# Patient Record
Sex: Male | Born: 1990
Health system: Southern US, Community
[De-identification: ages and names within clinical notes are randomized; demographics above are authoritative.]

## PROBLEM LIST (undated history)

## (undated) DIAGNOSIS — F988 Other specified behavioral and emotional disorders with onset usually occurring in childhood and adolescence: Secondary | ICD-10-CM

## (undated) DIAGNOSIS — J45990 Exercise induced bronchospasm: Secondary | ICD-10-CM

## (undated) DIAGNOSIS — G4730 Sleep apnea, unspecified: Secondary | ICD-10-CM

## (undated) DIAGNOSIS — H5000 Unspecified esotropia: Secondary | ICD-10-CM

## (undated) DIAGNOSIS — F329 Major depressive disorder, single episode, unspecified: Secondary | ICD-10-CM

## (undated) HISTORY — PX: ELECTROCONVULSIVE THERAPY: SHX1495

---
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
## 2014-12-08 ENCOUNTER — Other Ambulatory Visit: Payer: Self-pay

## 2014-12-08 DIAGNOSIS — F988 Other specified behavioral and emotional disorders with onset usually occurring in childhood and adolescence: Secondary | ICD-10-CM

## 2014-12-08 MED ORDER — AMPHETAMINE-DEXTROAMPHETAMINE 20 MG PO TABS: 20.0000 mg | ORAL_TABLET | Freq: Every day | ORAL | Status: DC

## 2014-12-08 MED ORDER — AMPHETAMINE-DEXTROAMPHET ER 30 MG PO CP24: 30.0000 mg | ORAL_CAPSULE | Freq: Every day | ORAL | Status: DC

## 2015-03-21 ENCOUNTER — Encounter: Payer: Self-pay | Admitting: Family Medicine

## 2015-03-21 ENCOUNTER — Ambulatory Visit (INDEPENDENT_AMBULATORY_CARE_PROVIDER_SITE_OTHER): Payer: 59 | Admitting: Family Medicine

## 2015-03-21 VITALS — BP 122/80 | HR 64 | Temp 98.6°F | Resp 16 | Ht 74.5 in | Wt 192.0 lb

## 2015-03-21 DIAGNOSIS — Z8709 Personal history of other diseases of the respiratory system: Secondary | ICD-10-CM | POA: Insufficient documentation

## 2015-03-21 DIAGNOSIS — F988 Other specified behavioral and emotional disorders with onset usually occurring in childhood and adolescence: Secondary | ICD-10-CM | POA: Insufficient documentation

## 2015-03-21 DIAGNOSIS — K529 Noninfective gastroenteritis and colitis, unspecified: Secondary | ICD-10-CM | POA: Insufficient documentation

## 2015-03-21 DIAGNOSIS — F329 Major depressive disorder, single episode, unspecified: Secondary | ICD-10-CM | POA: Diagnosis not present

## 2015-03-21 DIAGNOSIS — F32A Depression, unspecified: Secondary | ICD-10-CM

## 2015-03-21 DIAGNOSIS — S42409A Unspecified fracture of lower end of unspecified humerus, initial encounter for closed fracture: Secondary | ICD-10-CM | POA: Insufficient documentation

## 2015-03-21 MED ORDER — BUPROPION HCL ER (SR) 150 MG PO TB12: 150.0000 mg | ORAL_TABLET | Freq: Two times a day (BID) | ORAL | Status: DC

## 2015-03-21 NOTE — Progress Notes (Signed)
Subjective:    Patient ID: Matthew Romero, male    DOB: 1991/01/07, 24 y.o.   MRN: [id number]  Depression        The onset quality is gradual.   The problem has been gradually worsening since onset.  Associated symptoms include suicidal ideas.  Associated symptoms include no decreased concentration and no headaches.( Some thoughts but no plan.  Would not care if wakes up.  Almost daily. Since got home. Not much as school.)     The symptoms are aggravated by social issues.   Has been depressed as long as he can remember.  Gets better and worse at times. 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 Trying to get a better paying job.  Wants to go back [place], if he can afford it.  A lot of his peers are there. Also, has a girl friend in [place].  Sertraline made him too flat.  Made him feel kind of numb.        Patient Active Problem List   Diagnosis Date Noted  . ADD (attention deficit disorder) 03/21/2015  . Enterogastritis 03/21/2015  . Closed fracture of part of lower end of humerus 03/21/2015  . H/O respiratory system disease 03/21/2015   Family History  Problem Relation Age of Onset  . ADD / ADHD Mother   . Depression Mother   . Depression Father   . ADD / ADHD Brother   . ADD / ADHD Brother    Social History   Social History  . Marital Status: Single    Spouse Name: N/A  . Number of Children: N/A  . Years of Education: College   Occupational History  .      Full-Time   Social History Main Topics  . Smoking status: Never Smoker   . Smokeless tobacco: Never Used  . Alcohol Use: 1.2 oz/week    2 Cans of beer per week  . Drug Use: No  . Sexual Activity: Not on file   Other Topics Concern  . Not on file   Social History Narrative  . No narrative on file   No Known Allergies  Previous Medications   AMPHETAMINE-DEXTROAMPHETAMINE (ADDERALL XR) 30 MG 24 HR CAPSULE    Take 1 capsule (30 mg total) by mouth daily.   AMPHETAMINE-DEXTROAMPHETAMINE (ADDERALL)  20 MG TABLET    Take 1 tablet (20 mg total) by mouth daily.     Review of Systems  Constitutional: Negative.   Respiratory: Positive for apnea (Occasionally). Negative for cough, choking, chest tightness, shortness of breath, wheezing and stridor.   Cardiovascular: Negative for chest pain, palpitations and leg swelling.  Gastrointestinal: Negative.   Neurological: Positive for light-headedness. Negative for dizziness, weakness, numbness and headaches.  Psychiatric/Behavioral: Positive for depression, suicidal ideas, sleep disturbance and dysphoric mood. Negative for hallucinations, behavioral problems, confusion, self-injury, decreased concentration and agitation. The patient is not nervous/anxious and is not hyperactive.        Objective:   Physical Exam  Constitutional: He is oriented to person, place, and time. He appears well-developed and well-nourished.  Neurological: He is alert and oriented to person, place, and time.  Psychiatric: He has a normal mood and affect. His behavior is normal. Judgment and thought content normal.   BP 122/80 mmHg  Pulse 64  Temp(Src) 98.6 F (37 C) (Oral)  Resp 16  Ht 6' 2.5" (1.892 m)  Wt 192 lb (87.091 kg)  BMI 24.33 kg/m2      Assessment &  Plan:  1. Depression  Condition is worsening. Will start medication for better control.  Contracted for safety. Recheck in 2 weeks.  - buPROPion (WELLBUTRIN SR) 150 MG 12 hr tablet; Take 1 tablet (150 mg total) by mouth 2 (two) times daily. 1 a day for 3 days and then one twice a day  Dispense: 180 tablet; Refill: 3  Margarita Rana, MD

## 2015-06-07 ENCOUNTER — Other Ambulatory Visit: Payer: Self-pay

## 2015-06-07 DIAGNOSIS — F329 Major depressive disorder, single episode, unspecified: Secondary | ICD-10-CM

## 2015-06-07 DIAGNOSIS — F32A Depression, unspecified: Secondary | ICD-10-CM

## 2015-06-07 MED ORDER — DULOXETINE HCL 30 MG PO CPEP
30.0000 mg | ORAL_CAPSULE | Freq: Every day | ORAL | Status: DC
Start: 2015-06-07 — End: 2015-06-28

## 2015-06-07 NOTE — Telephone Encounter (Signed)
Pt reports he is currently taking Wellbutrin and has helped some but he is not to goal.  He would like to try Cymbalta 30mg .  He will scheduled an office visit in a few weeks to follow up.   Thanks,   -Mickel Baas

## 2015-06-28 ENCOUNTER — Ambulatory Visit: Payer: Self-pay | Admitting: Family Medicine

## 2015-06-28 ENCOUNTER — Encounter: Payer: Self-pay | Admitting: Family Medicine

## 2015-06-28 ENCOUNTER — Ambulatory Visit (INDEPENDENT_AMBULATORY_CARE_PROVIDER_SITE_OTHER): Payer: 59 | Admitting: Family Medicine

## 2015-06-28 VITALS — BP 104/68 | HR 72 | Temp 98.7°F | Resp 16 | Ht 75.0 in | Wt 184.0 lb

## 2015-06-28 DIAGNOSIS — F988 Other specified behavioral and emotional disorders with onset usually occurring in childhood and adolescence: Secondary | ICD-10-CM

## 2015-06-28 DIAGNOSIS — F909 Attention-deficit hyperactivity disorder, unspecified type: Secondary | ICD-10-CM | POA: Diagnosis not present

## 2015-06-28 DIAGNOSIS — F329 Major depressive disorder, single episode, unspecified: Secondary | ICD-10-CM | POA: Diagnosis not present

## 2015-06-28 DIAGNOSIS — Z23 Encounter for immunization: Secondary | ICD-10-CM

## 2015-06-28 DIAGNOSIS — F32A Depression, unspecified: Secondary | ICD-10-CM

## 2015-06-28 MED ORDER — AMPHETAMINE-DEXTROAMPHETAMINE 20 MG PO TABS: 20.0000 mg | ORAL_TABLET | Freq: Every day | ORAL | Status: DC

## 2015-06-28 MED ORDER — MIRTAZAPINE 15 MG PO TABS: 15.0000 mg | ORAL_TABLET | Freq: Every day | ORAL | Status: DC

## 2015-06-28 NOTE — Progress Notes (Signed)
Patient ID: [name], male   DOB: [birth year], 24 y.o.   MRN: [id number]       Patient: Matthew Romero Male    DOB: [birth year]   24 y.o.   MRN: [id number] Visit Date: 06/28/2015  Today's Provider: Margarita Rana, MD   Chief Complaint  Patient presents with  . Depression    3 month FU.    Subjective:    Depression      The patient presents with depression.  This is a chronic problem.  The current episode started more than 1 year ago.   The problem has been waxing and waning since onset.  Associated symptoms include fatigue and decreased interest.  Associated symptoms include no decreased concentration, no headaches, not sad and no suicidal ideas.( Still depressed.   )  Past treatments include SSRIs - Selective serotonin reuptake inhibitors and SNRIs - Serotonin and norepinephrine reuptake inhibitors.  Compliance with treatment is good.  Previous treatment provided mild relief.  Risk factors include major life event.   Past medical history includes depression.   Patient reports that he has been on Wellbutrin for about 3 months and has noticed a mild improvement in his mood. Patient reports that he has been compliant with taking his medication. Patient reports that he does not take Cymbalta. Took it for about 2 weeks. Did not really help mood.  Did kind of numb his mood, but not as numbing as Zoloft.  Is working 39 hours a week.  Doing some mild exercise, but not a lot of cardio.    Does not take Adderall when he is not in school. Does not think counseling will help.        No Known Allergies Previous Medications   AMPHETAMINE-DEXTROAMPHETAMINE (ADDERALL XR) 30 MG 24 HR CAPSULE    Take 1 capsule (30 mg total) by mouth daily.   AMPHETAMINE-DEXTROAMPHETAMINE (ADDERALL) 20 MG TABLET    Take 1 tablet (20 mg total) by mouth daily.   BUPROPION (WELLBUTRIN SR) 150 MG 12 HR TABLET    Take 1 tablet (150 mg total) by mouth 2 (two) times daily. 1 a day for 3 days and then one twice  a day   DULOXETINE (CYMBALTA) 30 MG CAPSULE    Take 1 capsule (30 mg total) by mouth daily.    Review of Systems  Constitutional: Positive for fatigue.  Respiratory: Negative.   Cardiovascular: Negative.   Neurological: Negative.  Negative for headaches.  Psychiatric/Behavioral: Positive for depression. Negative for suicidal ideas, self-injury and decreased concentration. The patient is not nervous/anxious.     Social History  Substance Use Topics  . Smoking status: Never Smoker   . Smokeless tobacco: Never Used  . Alcohol Use: 1.2 oz/week    2 Cans of beer per week   Objective:   BP 104/68 mmHg  Pulse 72  Temp(Src) 98.7 F (37.1 C)  Resp 16  Ht 6\' 3"  (1.905 m)  Wt 184 lb (83.462 kg)  BMI 23.00 kg/m2  SpO2 98%  Physical Exam  Constitutional: He is oriented to person, place, and time. He appears well-developed and well-nourished.  Neurological: He is alert and oriented to person, place, and time.  Psychiatric: He has a normal mood and affect. His behavior is normal. Judgment and thought content normal.      Assessment & Plan:     1. ADD (attention deficit disorder) Continue current medication.  Not taking currently. Did not fill today.   - amphetamine-dextroamphetamine (ADDERALL)  20 MG tablet; Take 1 tablet (20 mg total) by mouth daily.  Dispense: 1 tablet; Refill: 0  2. Depression Really not improved on previous medication tried.  Too numb on Zoloft and Cymbalta had similar effect. No SI.  Does have sleep issues. Will try Mirtazapine and recheck in 4 weeks. Call in interim if any changes or concerns.  Did encourage him to reconsider counselor.    - mirtazapine (REMERON) 15 MG tablet; Take 1 tablet (15 mg total) by mouth at bedtime.  Dispense: 90 tablet; Refill: 0  3. Need for influenza vaccination Given today.  - Flu Vaccine QUAD 36+ mos PF IM (Fluarix & Fluzone Quad PF)       Margarita Rana, MD  [facility]

## 2015-07-15 ENCOUNTER — Telehealth: Payer: Self-pay | Admitting: Family Medicine

## 2015-07-15 DIAGNOSIS — F329 Major depressive disorder, single episode, unspecified: Secondary | ICD-10-CM

## 2015-07-15 DIAGNOSIS — F32A Depression, unspecified: Secondary | ICD-10-CM

## 2015-07-15 NOTE — Telephone Encounter (Signed)
Talked with  Dad. Patient still depressed. Having at hard time focusing.  Would like to see Dr. Nicolasa Ducking. Will put in order. Can you please check on this Judson Roch. Thanks.

## 2015-07-16 DIAGNOSIS — F900 Attention-deficit hyperactivity disorder, predominantly inattentive type: Secondary | ICD-10-CM | POA: Diagnosis not present

## 2015-07-16 DIAGNOSIS — F331 Major depressive disorder, recurrent, moderate: Secondary | ICD-10-CM | POA: Diagnosis not present

## 2015-07-25 DIAGNOSIS — F331 Major depressive disorder, recurrent, moderate: Secondary | ICD-10-CM | POA: Diagnosis not present

## 2015-07-25 DIAGNOSIS — F900 Attention-deficit hyperactivity disorder, predominantly inattentive type: Secondary | ICD-10-CM | POA: Diagnosis not present

## 2015-07-31 ENCOUNTER — Ambulatory Visit: Payer: Self-pay | Admitting: Family Medicine

## 2015-08-09 DIAGNOSIS — F331 Major depressive disorder, recurrent, moderate: Secondary | ICD-10-CM | POA: Diagnosis not present

## 2015-08-09 DIAGNOSIS — F900 Attention-deficit hyperactivity disorder, predominantly inattentive type: Secondary | ICD-10-CM | POA: Diagnosis not present

## 2015-08-29 DIAGNOSIS — F331 Major depressive disorder, recurrent, moderate: Secondary | ICD-10-CM | POA: Diagnosis not present

## 2015-08-29 DIAGNOSIS — F900 Attention-deficit hyperactivity disorder, predominantly inattentive type: Secondary | ICD-10-CM | POA: Diagnosis not present

## 2015-09-17 DIAGNOSIS — F900 Attention-deficit hyperactivity disorder, predominantly inattentive type: Secondary | ICD-10-CM | POA: Diagnosis not present

## 2015-09-17 DIAGNOSIS — F331 Major depressive disorder, recurrent, moderate: Secondary | ICD-10-CM | POA: Diagnosis not present

## 2015-10-02 DIAGNOSIS — F900 Attention-deficit hyperactivity disorder, predominantly inattentive type: Secondary | ICD-10-CM | POA: Diagnosis not present

## 2015-10-02 DIAGNOSIS — F331 Major depressive disorder, recurrent, moderate: Secondary | ICD-10-CM | POA: Diagnosis not present

## 2015-10-03 DIAGNOSIS — F331 Major depressive disorder, recurrent, moderate: Secondary | ICD-10-CM | POA: Diagnosis not present

## 2015-10-03 DIAGNOSIS — F900 Attention-deficit hyperactivity disorder, predominantly inattentive type: Secondary | ICD-10-CM | POA: Diagnosis not present

## 2015-10-23 DIAGNOSIS — Z79899 Other long term (current) drug therapy: Secondary | ICD-10-CM | POA: Diagnosis not present

## 2015-10-23 DIAGNOSIS — F900 Attention-deficit hyperactivity disorder, predominantly inattentive type: Secondary | ICD-10-CM | POA: Diagnosis not present

## 2015-10-23 DIAGNOSIS — F331 Major depressive disorder, recurrent, moderate: Secondary | ICD-10-CM | POA: Diagnosis not present

## 2015-10-23 LAB — LIPID PANEL
Cholesterol: 173 mg/dL (ref 0–200)
HDL: 63 mg/dL (ref 35–70)
LDL CALC: 87 mg/dL
Triglycerides: 117 mg/dL (ref 40–160)

## 2015-10-23 LAB — HEMOGLOBIN A1C: Hemoglobin A1C: 5.6

## 2016-01-22 DIAGNOSIS — F331 Major depressive disorder, recurrent, moderate: Secondary | ICD-10-CM | POA: Diagnosis not present

## 2016-01-22 DIAGNOSIS — F900 Attention-deficit hyperactivity disorder, predominantly inattentive type: Secondary | ICD-10-CM | POA: Diagnosis not present

## 2016-02-18 DIAGNOSIS — R079 Chest pain, unspecified: Secondary | ICD-10-CM | POA: Diagnosis not present

## 2016-02-18 DIAGNOSIS — R0789 Other chest pain: Secondary | ICD-10-CM | POA: Diagnosis not present

## 2016-04-05 DIAGNOSIS — F331 Major depressive disorder, recurrent, moderate: Secondary | ICD-10-CM | POA: Diagnosis not present

## 2016-04-05 DIAGNOSIS — F900 Attention-deficit hyperactivity disorder, predominantly inattentive type: Secondary | ICD-10-CM | POA: Diagnosis not present

## 2016-04-11 DIAGNOSIS — F331 Major depressive disorder, recurrent, moderate: Secondary | ICD-10-CM | POA: Diagnosis not present

## 2016-04-14 DIAGNOSIS — F331 Major depressive disorder, recurrent, moderate: Secondary | ICD-10-CM | POA: Diagnosis not present

## 2016-04-14 DIAGNOSIS — F900 Attention-deficit hyperactivity disorder, predominantly inattentive type: Secondary | ICD-10-CM | POA: Diagnosis not present

## 2016-04-16 DIAGNOSIS — F331 Major depressive disorder, recurrent, moderate: Secondary | ICD-10-CM | POA: Diagnosis not present

## 2016-04-21 DIAGNOSIS — F332 Major depressive disorder, recurrent severe without psychotic features: Secondary | ICD-10-CM | POA: Diagnosis not present

## 2016-04-24 DIAGNOSIS — F900 Attention-deficit hyperactivity disorder, predominantly inattentive type: Secondary | ICD-10-CM | POA: Diagnosis not present

## 2016-04-24 DIAGNOSIS — F331 Major depressive disorder, recurrent, moderate: Secondary | ICD-10-CM | POA: Diagnosis not present

## 2016-04-24 DIAGNOSIS — F5105 Insomnia due to other mental disorder: Secondary | ICD-10-CM | POA: Diagnosis not present

## 2016-04-28 DIAGNOSIS — F332 Major depressive disorder, recurrent severe without psychotic features: Secondary | ICD-10-CM | POA: Diagnosis not present

## 2016-05-02 DIAGNOSIS — F331 Major depressive disorder, recurrent, moderate: Secondary | ICD-10-CM | POA: Diagnosis not present

## 2016-05-02 DIAGNOSIS — F900 Attention-deficit hyperactivity disorder, predominantly inattentive type: Secondary | ICD-10-CM | POA: Diagnosis not present

## 2016-05-02 DIAGNOSIS — F5105 Insomnia due to other mental disorder: Secondary | ICD-10-CM | POA: Diagnosis not present

## 2016-05-05 DIAGNOSIS — F331 Major depressive disorder, recurrent, moderate: Secondary | ICD-10-CM | POA: Diagnosis not present

## 2016-05-05 DIAGNOSIS — F900 Attention-deficit hyperactivity disorder, predominantly inattentive type: Secondary | ICD-10-CM | POA: Diagnosis not present

## 2016-05-06 ENCOUNTER — Ambulatory Visit (INDEPENDENT_AMBULATORY_CARE_PROVIDER_SITE_OTHER): Payer: 59 | Admitting: Family Medicine

## 2016-05-06 DIAGNOSIS — F341 Dysthymic disorder: Secondary | ICD-10-CM

## 2016-05-06 DIAGNOSIS — Z23 Encounter for immunization: Secondary | ICD-10-CM | POA: Diagnosis not present

## 2016-05-06 DIAGNOSIS — Z111 Encounter for screening for respiratory tuberculosis: Secondary | ICD-10-CM

## 2016-05-08 LAB — QUANTIFERON IN TUBE
QFT TB AG MINUS NIL VALUE: 0 [IU]/mL
QUANTIFERON MITOGEN VALUE: 9.72 [IU]/mL
QUANTIFERON TB AG VALUE: 0.02 [IU]/mL
QUANTIFERON TB GOLD: NEGATIVE
Quantiferon Nil Value: 0.02 IU/mL

## 2016-05-08 LAB — QUANTIFERON TB GOLD ASSAY (BLOOD)

## 2016-05-09 ENCOUNTER — Other Ambulatory Visit: Payer: Self-pay | Admitting: Neurology

## 2016-05-09 DIAGNOSIS — F900 Attention-deficit hyperactivity disorder, predominantly inattentive type: Secondary | ICD-10-CM | POA: Diagnosis not present

## 2016-05-09 DIAGNOSIS — R41840 Attention and concentration deficit: Secondary | ICD-10-CM

## 2016-05-09 DIAGNOSIS — F5105 Insomnia due to other mental disorder: Secondary | ICD-10-CM | POA: Diagnosis not present

## 2016-05-09 DIAGNOSIS — F341 Dysthymic disorder: Secondary | ICD-10-CM | POA: Diagnosis not present

## 2016-05-09 NOTE — Progress Notes (Signed)
Patient needs flu shot and  TB blood test.

## 2016-05-14 DIAGNOSIS — F900 Attention-deficit hyperactivity disorder, predominantly inattentive type: Secondary | ICD-10-CM | POA: Diagnosis not present

## 2016-05-14 DIAGNOSIS — F331 Major depressive disorder, recurrent, moderate: Secondary | ICD-10-CM | POA: Diagnosis not present

## 2016-05-15 DIAGNOSIS — F900 Attention-deficit hyperactivity disorder, predominantly inattentive type: Secondary | ICD-10-CM | POA: Diagnosis not present

## 2016-05-15 DIAGNOSIS — F5105 Insomnia due to other mental disorder: Secondary | ICD-10-CM | POA: Diagnosis not present

## 2016-05-15 DIAGNOSIS — F341 Dysthymic disorder: Secondary | ICD-10-CM | POA: Diagnosis not present

## 2016-05-19 ENCOUNTER — Other Ambulatory Visit: Payer: Self-pay

## 2016-05-19 DIAGNOSIS — F988 Other specified behavioral and emotional disorders with onset usually occurring in childhood and adolescence: Secondary | ICD-10-CM

## 2016-05-19 DIAGNOSIS — R292 Abnormal reflex: Secondary | ICD-10-CM

## 2016-05-19 DIAGNOSIS — F329 Major depressive disorder, single episode, unspecified: Secondary | ICD-10-CM

## 2016-05-19 DIAGNOSIS — R5383 Other fatigue: Secondary | ICD-10-CM

## 2016-05-19 DIAGNOSIS — F32A Depression, unspecified: Secondary | ICD-10-CM

## 2016-05-20 DIAGNOSIS — R292 Abnormal reflex: Secondary | ICD-10-CM | POA: Diagnosis not present

## 2016-05-20 DIAGNOSIS — R5383 Other fatigue: Secondary | ICD-10-CM | POA: Diagnosis not present

## 2016-05-20 DIAGNOSIS — F988 Other specified behavioral and emotional disorders with onset usually occurring in childhood and adolescence: Secondary | ICD-10-CM | POA: Diagnosis not present

## 2016-05-20 DIAGNOSIS — F329 Major depressive disorder, single episode, unspecified: Secondary | ICD-10-CM | POA: Diagnosis not present

## 2016-05-21 ENCOUNTER — Ambulatory Visit: Payer: 59

## 2016-05-24 LAB — VITAMIN D 25 HYDROXY (VIT D DEFICIENCY, FRACTURES): Vit D, 25-Hydroxy: 24 ng/mL — ABNORMAL LOW (ref 30.0–100.0)

## 2016-05-24 LAB — B12 AND FOLATE PANEL
FOLATE: 13.8 ng/mL (ref >3.0)
VITAMIN B 12: 1120 pg/mL — AB (ref 211–946)

## 2016-05-24 LAB — METHYLMALONIC ACID(MMA), RND URINE
Creatinine(Crt), U: 1.15 g/L (ref 0.30–3.00)
METHYLMALONIC ACID UR: 13.9 umol/L (ref 1.6–29.7)
MMA - NORMALIZED: 1.4 umol/mmol{creat} (ref 0.4–2.5)

## 2016-05-26 ENCOUNTER — Telehealth: Payer: Self-pay | Admitting: Family Medicine

## 2016-05-26 NOTE — Telephone Encounter (Signed)
Faxed labs from 05-19-16  to Dr. Manuella Ghazi & Dr. Cephus Shelling  Per Dr. Caryn Section.  Thanks CC

## 2016-05-27 DIAGNOSIS — S61432A Puncture wound without foreign body of left hand, initial encounter: Secondary | ICD-10-CM | POA: Diagnosis not present

## 2016-05-28 ENCOUNTER — Telehealth: Payer: Self-pay

## 2016-05-28 DIAGNOSIS — S61422A Laceration with foreign body of left hand, initial encounter: Secondary | ICD-10-CM

## 2016-05-28 NOTE — Telephone Encounter (Signed)
Order placed for hand laceration.

## 2016-05-29 DIAGNOSIS — M79642 Pain in left hand: Secondary | ICD-10-CM | POA: Diagnosis not present

## 2016-05-29 DIAGNOSIS — S61432A Puncture wound without foreign body of left hand, initial encounter: Secondary | ICD-10-CM | POA: Diagnosis not present

## 2016-05-30 ENCOUNTER — Ambulatory Visit
Admission: RE | Admit: 2016-05-30 | Discharge: 2016-05-30 | Disposition: A | Discharge status: Home / Self Care | Payer: 59 | Source: Ambulatory Visit | Attending: Neurology | Admitting: Neurology

## 2016-05-30 DIAGNOSIS — F339 Major depressive disorder, recurrent, unspecified: Secondary | ICD-10-CM | POA: Diagnosis not present

## 2016-05-30 DIAGNOSIS — R41840 Attention and concentration deficit: Secondary | ICD-10-CM | POA: Insufficient documentation

## 2016-05-30 MED ORDER — GADOBENATE DIMEGLUMINE 529 MG/ML IV SOLN
15.0000 mL | Freq: Once | INTRAVENOUS | Status: AC | PRN
  Administered 2016-05-30: 15 mL via INTRAVENOUS

## 2016-06-02 DIAGNOSIS — S61432S Puncture wound without foreign body of left hand, sequela: Secondary | ICD-10-CM | POA: Diagnosis not present

## 2016-06-02 DIAGNOSIS — L089 Local infection of the skin and subcutaneous tissue, unspecified: Secondary | ICD-10-CM | POA: Diagnosis not present

## 2016-06-03 DIAGNOSIS — F331 Major depressive disorder, recurrent, moderate: Secondary | ICD-10-CM | POA: Diagnosis not present

## 2016-06-05 DIAGNOSIS — F331 Major depressive disorder, recurrent, moderate: Secondary | ICD-10-CM | POA: Diagnosis not present

## 2016-06-11 DIAGNOSIS — S61432D Puncture wound without foreign body of left hand, subsequent encounter: Secondary | ICD-10-CM | POA: Diagnosis not present

## 2016-06-11 DIAGNOSIS — M79642 Pain in left hand: Secondary | ICD-10-CM | POA: Diagnosis not present

## 2016-07-07 DIAGNOSIS — F331 Major depressive disorder, recurrent, moderate: Secondary | ICD-10-CM | POA: Diagnosis not present

## 2016-07-29 DIAGNOSIS — F5105 Insomnia due to other mental disorder: Secondary | ICD-10-CM | POA: Diagnosis not present

## 2016-07-29 DIAGNOSIS — F332 Major depressive disorder, recurrent severe without psychotic features: Secondary | ICD-10-CM | POA: Diagnosis not present

## 2016-07-29 DIAGNOSIS — F900 Attention-deficit hyperactivity disorder, predominantly inattentive type: Secondary | ICD-10-CM | POA: Diagnosis not present

## 2016-08-01 ENCOUNTER — Encounter
Admission: RE | Admit: 2016-08-01 | Discharge: 2016-08-01 | Disposition: A | Discharge status: Home / Self Care | Payer: 59 | Source: Ambulatory Visit | Attending: Psychiatry | Admitting: Psychiatry

## 2016-08-01 ENCOUNTER — Ambulatory Visit
Admission: RE | Admit: 2016-08-01 | Discharge: 2016-08-01 | Disposition: A | Discharge status: Home / Self Care | Payer: 59 | Source: Ambulatory Visit | Attending: Psychiatry | Admitting: Psychiatry

## 2016-08-01 ENCOUNTER — Other Ambulatory Visit: Payer: Self-pay | Admitting: Psychiatry

## 2016-08-01 ENCOUNTER — Encounter: Payer: Self-pay | Admitting: *Deleted

## 2016-08-01 ENCOUNTER — Ambulatory Visit (INDEPENDENT_AMBULATORY_CARE_PROVIDER_SITE_OTHER): Payer: 59 | Admitting: Psychiatry

## 2016-08-01 ENCOUNTER — Ambulatory Visit: Payer: 59

## 2016-08-01 VITALS — BP 125/77 | HR 96 | Temp 98.6°F | Wt 211.6 lb

## 2016-08-01 DIAGNOSIS — F332 Major depressive disorder, recurrent severe without psychotic features: Secondary | ICD-10-CM | POA: Insufficient documentation

## 2016-08-01 DIAGNOSIS — Z01812 Encounter for preprocedural laboratory examination: Secondary | ICD-10-CM | POA: Diagnosis not present

## 2016-08-01 DIAGNOSIS — Z01818 Encounter for other preprocedural examination: Secondary | ICD-10-CM

## 2016-08-01 DIAGNOSIS — J45909 Unspecified asthma, uncomplicated: Secondary | ICD-10-CM | POA: Diagnosis not present

## 2016-08-01 DIAGNOSIS — Z01810 Encounter for preprocedural cardiovascular examination: Secondary | ICD-10-CM | POA: Diagnosis not present

## 2016-08-01 LAB — CBC
HEMATOCRIT: 44.8 % (ref 40.0–52.0)
Hemoglobin: 15.4 g/dL (ref 13.0–18.0)
MCH: 30.6 pg (ref 26.0–34.0)
MCHC: 34.4 g/dL (ref 32.0–36.0)
MCV: 89 fL (ref 80.0–100.0)
PLATELETS: 174 10*3/uL (ref 150–440)
RBC: 5.03 MIL/uL (ref 4.40–5.90)
RDW: 13.6 % (ref 11.5–14.5)
WBC: 5.5 10*3/uL (ref 3.8–10.6)

## 2016-08-01 LAB — BASIC METABOLIC PANEL
ANION GAP: 7 (ref 5–15)
BUN: 17 mg/dL (ref 6–20)
CALCIUM: 9.5 mg/dL (ref 8.9–10.3)
CO2: 29 mmol/L (ref 22–32)
Chloride: 101 mmol/L (ref 101–111)
Creatinine, Ser: 1.06 mg/dL (ref 0.61–1.24)
Glucose, Bld: 96 mg/dL (ref 65–99)
Potassium: 3.9 mmol/L (ref 3.5–5.1)
Sodium: 137 mmol/L (ref 135–145)

## 2016-08-01 LAB — URINALYSIS, COMPLETE (UACMP) WITH MICROSCOPIC
BILIRUBIN URINE: NEGATIVE
Bacteria, UA: NONE SEEN
GLUCOSE, UA: NEGATIVE mg/dL
HGB URINE DIPSTICK: NEGATIVE
KETONES UR: NEGATIVE mg/dL
Leukocytes, UA: NEGATIVE
NITRITE: NEGATIVE
PH: 8 (ref 5.0–8.0)
Protein, ur: NEGATIVE mg/dL
Specific Gravity, Urine: 1.021 (ref 1.005–1.030)
Squamous Epithelial / LPF: NONE SEEN

## 2016-08-01 NOTE — Patient Instructions (Signed)
  Your procedure is scheduled on: 08/04/16 Mon Instructed to arrive at 8:30Am Report to Same Day Surgery 2nd floor medical mall [facility] Entrance-take elevator on left to 2nd floor.  Check in with surgery information desk.) To find out your arrival time please call [phone] Remember: Instructions that are not followed completely may result in serious medical risk, up to and including death, or upon the discretion of your surgeon and anesthesiologist your surgery may need to be rescheduled.    _x___ 1. Do not eat food or drink liquids after midnight. No gum chewing or hard candies.     __x__ 2. No Alcohol for 24 hours before or after surgery.   __x__3. No Smoking for 24 prior to surgery.   ____  4. Bring all medications with you on the day of surgery if instructed.    __x__ 5. Notify your doctor if there is any change in your medical condition     (cold, fever, infections).     Do not wear jewelry, make-up, hairpins, clips or nail polish.  Do not wear lotions, powders, or perfumes. You may wear deodorant.  Do not shave 48 hours prior to surgery. Men may shave face and neck.  Do not bring valuables to the hospital.    [facility] is not responsible for any belongings or valuables.               Contacts, dentures or bridgework may not be worn into surgery.  Leave your suitcase in the car. After surgery it may be brought to your room.  For patients admitted to the hospital, discharge time is determined by your treatment team.   Patients discharged the day of surgery will not be allowed to drive home.  You will need someone to drive you home and stay with you the night of your procedure.    Please read over the following fact sheets that you were given:   [facility] Preparing for Surgery and or MRSA Information   _x___ Take these medicines the morning of surgery with A SIP OF WATER:    1. DULoxetine (CYMBALTA  2.FLUoxetine (PROZAC  3.Brexpiprazole   4.  5.  6.  ____Fleets  enema or Magnesium Citrate as directed.   ___ Use CHG Soap or sage wipes as directed on instruction sheet   ____ Use inhalers on the day of surgery and bring to hospital day of surgery  ____ Stop metformin 2 days prior to surgery    ____ Take 1/2 of usual insulin dose the night before surgery and none on the morning of           surgery.   ____ Stop Aspirin, Coumadin, Pllavix ,Eliquis, Effient, or Pradaxa  _ Stop Anti-inflammatories such as Advil, Aleve, Ibuprofen, Motrin, Naproxen,          Naprosyn, Goodies powders or aspirin products. Ok to take Tylenol.   ____ Stop supplements until after surgery.    ____ Bring C-Pap to the hospital.

## 2016-08-01 NOTE — Progress Notes (Signed)
This is a ECT consult for this 26 year old man referred by Dr.Kapur. Patient was seen in my office this afternoon in the company of his father as well. I reviewed notes that had been sent to me by the referring psychiatrist and other old chart. 26 year old man reports symptoms of depression that have been going on probably for a couple of years but have gotten much worse over the last several months. Symptoms include persistently depressed mood, lack of motivation, lethargy, lack of interest in enjoyable activities. Excess tiredness. No change in appetite or weight. He has had passive suicidal thoughts without intent or plan. Patient has been cooperative with appropriate psychiatric medication and therapy and has shown minimal to no response. 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 Patient denies regular or heavy alcohol abuse. Has used marijuana very infrequently but not regularly. He has been referred to consider ECT because of lack of response to current medication. He is currently taking Prozac 40 mg a day, Rexulti 2 mg per day and Cymbalta 60 mg a day and has been on all medicines for over a month. Major stresses appear to include problems with staying active in school and possibly with fulfilling expectations in career. Also a girlfriend and he have recently had a decline in their relationship and she is no longer living close by.  Social history: Patient is a native of [place]. Father is a prominent local physician. Appears to have a good and very supportive relationship with his parents.  Medical history: Very mild asthma not actively requiring treatment. No other significant medical problems.  Substance abuse history: Very infrequent alcohol and marijuana use does not appear to of ever been a problem.  Past psychiatric history: Patient was treated for  attention deficit disorder as early as several years ago. Has been on Adderall with doses up to 40 or 50 mg per day. Has had partial improvement at times but it does not appear to be making a significant difference in allowing him to complete his studies or improve his mood currently. He has no history of past suicide attempts. No history of psychiatric hospitalization. Multiple antidepressants as noted above. No history of manic symptoms or psychosis.  Family history positive for depression in a parent but no history of suicide in the family psychosis or bipolar disorder  Patient is casually dressed and neatly dressed well groomed man looks his stated age. Cooperative with interview. Good eye contact. A little bit slow with limited psychomotor activity. Speech decreased in total amount. Affect blunted but not profoundly abnormal. Mood depressed. Thoughts are lucid without any loosening of associations or disorganized thinking. Denies any hallucinations. Has had passive suicidal thoughts without any intent or plan. Has positive things in his life that are supportive to him. No homicidal ideation. Slightly slowed in his cognitions but able to make reasonable decisions and understand medical information.  Labs have all come back as of the time of this dictation and everything looks normal. No history of general anesthesia in the past. No problems with teeth.  26 year old man with major depression severe recurrent without psychotic features that has been going on for several months. He has received appropriate treatment with more than one antidepressant without adequate improvement. Life is severely compromised by symptoms with inability to function at school and some suicidal thinking. Patient is a good candidate  for ECT and does not have any contraindications to the treatment. She was informed of my recommendation that we proceed with ECT if he is willing. Treatment was described in detail and he was given  full opportunity to ask questions. Patient expressed an understanding of the risks and benefits. He is agreeable to a plan to beginning ECT treatment.  Labs including EKG, chest x-ray, basic metabolic panel CBC and urinalysis were all ordered and at this point of all come back unremarkable. I have discussed this case with the ECT treatment team. We will put him on the schedule to begin Monday getting right unilateral treatment on a 3 times a week schedule.

## 2016-08-03 ENCOUNTER — Other Ambulatory Visit: Payer: Self-pay | Admitting: Psychiatry

## 2016-08-04 ENCOUNTER — Ambulatory Visit
Admission: RE | Admit: 2016-08-04 | Discharge: 2016-08-04 | Disposition: A | Discharge status: Home / Self Care | Payer: 59 | Source: Ambulatory Visit | Attending: Psychiatry | Admitting: Psychiatry

## 2016-08-04 ENCOUNTER — Encounter: Payer: Self-pay | Admitting: Anesthesiology

## 2016-08-04 DIAGNOSIS — Z811 Family history of alcohol abuse and dependence: Secondary | ICD-10-CM | POA: Insufficient documentation

## 2016-08-04 DIAGNOSIS — F17290 Nicotine dependence, other tobacco product, uncomplicated: Secondary | ICD-10-CM | POA: Diagnosis not present

## 2016-08-04 DIAGNOSIS — F332 Major depressive disorder, recurrent severe without psychotic features: Secondary | ICD-10-CM | POA: Diagnosis not present

## 2016-08-04 DIAGNOSIS — J45909 Unspecified asthma, uncomplicated: Secondary | ICD-10-CM | POA: Insufficient documentation

## 2016-08-04 DIAGNOSIS — F909 Attention-deficit hyperactivity disorder, unspecified type: Secondary | ICD-10-CM | POA: Diagnosis not present

## 2016-08-04 DIAGNOSIS — Z818 Family history of other mental and behavioral disorders: Secondary | ICD-10-CM | POA: Insufficient documentation

## 2016-08-04 DIAGNOSIS — F329 Major depressive disorder, single episode, unspecified: Secondary | ICD-10-CM | POA: Diagnosis not present

## 2016-08-04 MED ORDER — SUCCINYLCHOLINE CHLORIDE 200 MG/10ML IV SOSY
PREFILLED_SYRINGE | INTRAVENOUS | Status: DC | PRN
  Administered 2016-08-04: 120 mg via INTRAVENOUS

## 2016-08-04 MED ORDER — METHOHEXITAL SODIUM 100 MG/10ML IV SOSY
PREFILLED_SYRINGE | INTRAVENOUS | Status: DC | PRN
  Administered 2016-08-04: 100 mg via INTRAVENOUS

## 2016-08-04 MED ORDER — SUCCINYLCHOLINE CHLORIDE 200 MG/10ML IV SOSY
PREFILLED_SYRINGE | INTRAVENOUS | Status: AC
  Filled 2016-08-04: qty 10

## 2016-08-04 MED ORDER — SODIUM CHLORIDE 0.9 % IV SOLN
INTRAVENOUS | Status: DC | PRN
  Administered 2016-08-04: 10:00:00 via INTRAVENOUS

## 2016-08-04 MED ORDER — SODIUM CHLORIDE 0.9 % IV SOLN
500.0000 mL | Freq: Once | INTRAVENOUS | Status: AC
  Administered 2016-08-04: 09:00:00 via INTRAVENOUS

## 2016-08-04 MED ORDER — MIDAZOLAM HCL 2 MG/2ML IJ SOLN
INTRAMUSCULAR | Status: AC
  Filled 2016-08-04: qty 2

## 2016-08-04 MED ORDER — LORAZEPAM 2 MG/ML IJ SOLN
INTRAMUSCULAR | Status: AC
  Filled 2016-08-04: qty 1

## 2016-08-04 MED ORDER — KETOROLAC TROMETHAMINE 30 MG/ML IJ SOLN
30.0000 mg | Freq: Once | INTRAMUSCULAR | Status: AC
  Administered 2016-08-04: 30 mg via INTRAVENOUS

## 2016-08-04 MED ORDER — KETOROLAC TROMETHAMINE 30 MG/ML IJ SOLN
INTRAMUSCULAR | Status: AC
  Administered 2016-08-04: 30 mg via INTRAVENOUS
  Filled 2016-08-04: qty 1

## 2016-08-04 MED ORDER — MIDAZOLAM HCL 2 MG/2ML IJ SOLN
INTRAMUSCULAR | Status: DC | PRN
  Administered 2016-08-04: 2 mg via INTRAVENOUS

## 2016-08-04 MED ORDER — METHOHEXITAL SODIUM 0.5 G IJ SOLR
INTRAMUSCULAR | Status: AC
  Filled 2016-08-04: qty 500

## 2016-08-04 NOTE — Procedures (Signed)
ECT SERVICES Physician's Interval Evaluation & Treatment Note  Patient Identification: Matthew Romero MRN:  [id number] Date of Evaluation:  08/04/2016 TX #: 1  MADRS: 43  MMSE: 30  P.E. Findings:  Heart and lungs normal. Vitals unremarkable. No physical findings.  Psychiatric Interval Note:  Major depression as noted previously  Subjective:  Patient is a 26 y.o. male seen for evaluation for Electroconvulsive Therapy. Depressed anxious.  Treatment Summary:   [x]   Right Unilateral             []  Bilateral   % Energy : 0.3 ms 30%   Impedance: 1300 ohms  Seizure Energy Index: 13,682 V squared  Postictal Suppression Index: 86%  Seizure Concordance Index: 95%  Medications  Pre Shock: Toradol 30 mg, Brevital 100 mg, succinylcholine 120 mg  Post Shock:    Seizure Duration: 52 seconds by EMG, 125 seconds by EEG   Comments: Long seizure. Good quality. Continue all parameters next treatment.   Lungs:  [x]   Clear to auscultation               []  Other:   Heart:    [x]   Regular rhythm             []  irregular rhythm    [x]   Previous H&P reviewed, patient examined and there are NO CHANGES                 []   Previous H&P reviewed, patient examined and there are changes noted.   Alethia Berthold, MD 2/5/201810:30 AM

## 2016-08-04 NOTE — Anesthesia Post-op Follow-up Note (Cosign Needed)
Anesthesia QCDR form completed.        

## 2016-08-04 NOTE — H&P (Signed)
Matthew Romero is an 26 y.o. male.   Chief Complaint: Patient with major depression who was referred for ECT evaluation. Patient reports depressive mood and multiple other symptoms of major depression. Plan is for ECT beginning today. No specific physical complaints HPI: History of depression going on several months to possibly as long as a couple years. Medications ineffective. Physically minimal medical problems or issues treatment today follow-up 3 times a week schedule with right unilateral treatment. Next treatment Wednesday.  Past Medical History:  Diagnosis Date  . ADHD (attention deficit hyperactivity disorder)   . Asthma   . Depression     Past Surgical History:  Procedure Laterality Date  . NO PAST SURGERIES      Family History  Problem Relation Age of Onset  . ADD / ADHD Mother   . Anxiety disorder Mother   . Depression Father   . ADD / ADHD Brother   . Depression Brother   . ADD / ADHD Brother   . Alcohol abuse Cousin   . Drug abuse Cousin    Social History:  reports that he has been smoking Cigars.  He has never used smokeless tobacco. He reports that he drinks about 1.8 oz of alcohol per week . He reports that he uses drugs, including Marijuana.  Allergies: No Known Allergies   (Not in a hospital admission)  No results found for this or any previous visit (from the past 48 hour(s)). No results found.  Review of Systems  Constitutional: Negative.   HENT: Negative.   Eyes: Negative.   Respiratory: Negative.   Cardiovascular: Negative.   Gastrointestinal: Negative.   Musculoskeletal: Negative.   Skin: Negative.   Neurological: Negative.   Psychiatric/Behavioral: Positive for depression and suicidal ideas. Negative for hallucinations, memory loss and substance abuse. The patient is nervous/anxious and has insomnia.     Blood pressure 121/82, pulse 75, temperature 97.9 F (36.6 C), temperature source Oral, resp. rate 16, height 6\' 3"  (1.905 m),  weight 96.2 kg (212 lb), SpO2 98 %. Physical Exam  Nursing note and vitals reviewed. Constitutional: He appears well-developed and well-nourished.  HENT:  Head: Normocephalic and atraumatic.  Eyes: Conjunctivae are normal. Pupils are equal, round, and reactive to light.  Neck: Normal range of motion.  Cardiovascular: Regular rhythm and normal heart sounds.   Respiratory: Effort normal. No respiratory distress.  GI: Soft.  Musculoskeletal: Normal range of motion.  Neurological: He is alert.  Skin: Skin is warm and dry.  Psychiatric: Judgment normal. His speech is delayed. He is slowed. Thought content is not paranoid. Cognition and memory are normal. He exhibits a depressed mood. He expresses no suicidal plans.     Assessment/Plan Treatment today follow-up Wednesday  Alethia Berthold, MD 08/04/2016, 10:28 AM

## 2016-08-04 NOTE — Transfer of Care (Signed)
Immediate Anesthesia Transfer of Care Note  Patient: Matthew Romero  Procedure(s) Performed: * No procedures listed *  Patient Location: PACU  Anesthesia Type:General  Level of Consciousness: responds to stimulation  Airway & Oxygen Therapy: Patient Spontanous Breathing and Patient connected to face mask oxygen  Post-op Assessment: Report given to RN, Post -op Vital signs reviewed and stable and Patient moving all extremities X 4  Post vital signs: Reviewed and stable  Last Vitals:  Vitals:   08/04/16 0856  BP: 121/82  Pulse: 75  Resp: 16  Temp: 36.6 C    Last Pain:  Vitals:   08/04/16 0856  TempSrc: Oral         Complications: No apparent anesthesia complications

## 2016-08-04 NOTE — Discharge Instructions (Signed)
1)  The drugs that you have been given will stay in your system until tomorrow so for the       next 24 hours you should not:  A. Drive an automobile  B. Make any legal decisions  C. Drink any alcoholic beverages  2)  You may resume your regular meals upon return home.  3)  A responsible adult must take you home.  Someone should stay with you for a few          hours, then be available by phone for the remainder of the treatment day.  4)  You May experience any of the following symptoms:  Headache, Nausea and a dry mouth (due to the medications you were given),  temporary memory loss and some confusion, or sore muscles (a warm bath  should help this).  If you you experience any of these symptoms let us know on                your return visit.  5)  Report any of the following: any acute discomfort, severe headache, or temperature        greater than 100.5 F.   Also report any unusual redness, swelling, drainage, or pain         at your IV site.    You may report Symptoms to:  [place]          Phone: [phone], ECT Department           or Dr. Prescott Gum office [phone]  6)  Your next ECT Treatment is Day Wednesday  Date August 06, 2016 at 830am  We will call 2 days prior to your scheduled appointment for arrival times.  7)  Nothing to eat or drink after midnight the night before your procedure.  8)  Take .     With a sip of water the morning of your procedure.  9)  Other Instructions: Call [phone] to cancel the morning of your procedure due         to illness or emergency.  10) We will call within 72 hours to assess how you are feeling.

## 2016-08-04 NOTE — Anesthesia Postprocedure Evaluation (Signed)
Anesthesia Post Note  Patient: Matthew Romero  Procedure(s) Performed: * No procedures listed *  Patient location during evaluation: PACU Anesthesia Type: General Level of consciousness: awake and alert Pain management: pain level controlled Vital Signs Assessment: post-procedure vital signs reviewed and stable Respiratory status: spontaneous breathing, nonlabored ventilation, respiratory function stable and patient connected to nasal cannula oxygen Cardiovascular status: blood pressure returned to baseline and stable Postop Assessment: no signs of nausea or vomiting Anesthetic complications: no     Last Vitals:  Vitals:   08/04/16 1118 08/04/16 1129  BP: 125/82 134/85  Pulse: 78 81  Resp: 17 16  Temp: 36.2 C     Last Pain:  Vitals:   08/04/16 1129  TempSrc:   PainSc: 2                  Martha Clan

## 2016-08-04 NOTE — Anesthesia Preprocedure Evaluation (Signed)
Anesthesia Evaluation  Patient identified by MRN, date of birth, ID band Patient awake    Reviewed: Allergy & Precautions, H&P , NPO status , Patient's Chart, lab work & pertinent test results, reviewed documented beta blocker date and time   History of Anesthesia Complications (+) Family history of anesthesia reaction and history of anesthetic complications  Airway Mallampati: II  TM Distance: >3 FB Neck ROM: full    Dental  (+) Chipped, Dental Advidsory Given   Pulmonary neg shortness of breath, asthma , neg sleep apnea, neg COPD, neg recent URI, Current Smoker,           Cardiovascular Exercise Tolerance: Good negative cardio ROS       Neuro/Psych PSYCHIATRIC DISORDERS (Depression and ADHD) negative neurological ROS     GI/Hepatic negative GI ROS, Neg liver ROS,   Endo/Other  negative endocrine ROS  Renal/GU negative Renal ROS  negative genitourinary   Musculoskeletal   Abdominal   Peds  Hematology negative hematology ROS (+)   Anesthesia Other Findings Past Medical History: No date: ADHD (attention deficit hyperactivity disorder) No date: Asthma No date: Depression   Reproductive/Obstetrics negative OB ROS                             Anesthesia Physical Anesthesia Plan  ASA: II  Anesthesia Plan: General   Post-op Pain Management:    Induction:   Airway Management Planned:   Additional Equipment:   Intra-op Plan:   Post-operative Plan:   Informed Consent: I have reviewed the patients History and Physical, chart, labs and discussed the procedure including the risks, benefits and alternatives for the proposed anesthesia with the patient or authorized representative who has indicated his/her understanding and acceptance.   Dental Advisory Given  Plan Discussed with: Anesthesiologist, CRNA and Surgeon  Anesthesia Plan Comments:         Anesthesia Quick  Evaluation

## 2016-08-06 ENCOUNTER — Encounter: Payer: Self-pay | Admitting: Anesthesiology

## 2016-08-06 ENCOUNTER — Encounter
Admission: RE | Admit: 2016-08-06 | Discharge: 2016-08-06 | Disposition: A | Discharge status: Home / Self Care | Payer: 59 | Source: Ambulatory Visit | Attending: Psychiatry | Admitting: Psychiatry

## 2016-08-06 ENCOUNTER — Other Ambulatory Visit: Payer: Self-pay | Admitting: Psychiatry

## 2016-08-06 DIAGNOSIS — J45909 Unspecified asthma, uncomplicated: Secondary | ICD-10-CM | POA: Diagnosis not present

## 2016-08-06 DIAGNOSIS — Z811 Family history of alcohol abuse and dependence: Secondary | ICD-10-CM | POA: Insufficient documentation

## 2016-08-06 DIAGNOSIS — Z818 Family history of other mental and behavioral disorders: Secondary | ICD-10-CM | POA: Diagnosis not present

## 2016-08-06 DIAGNOSIS — F17290 Nicotine dependence, other tobacco product, uncomplicated: Secondary | ICD-10-CM | POA: Insufficient documentation

## 2016-08-06 DIAGNOSIS — F332 Major depressive disorder, recurrent severe without psychotic features: Secondary | ICD-10-CM | POA: Diagnosis not present

## 2016-08-06 DIAGNOSIS — F909 Attention-deficit hyperactivity disorder, unspecified type: Secondary | ICD-10-CM | POA: Diagnosis not present

## 2016-08-06 DIAGNOSIS — F329 Major depressive disorder, single episode, unspecified: Secondary | ICD-10-CM | POA: Diagnosis not present

## 2016-08-06 MED ORDER — KETOROLAC TROMETHAMINE 30 MG/ML IJ SOLN: 30.0000 mg | Freq: Once | INTRAMUSCULAR | Status: DC

## 2016-08-06 MED ORDER — MIDAZOLAM HCL 2 MG/2ML IJ SOLN
INTRAMUSCULAR | Status: AC
  Filled 2016-08-06: qty 2

## 2016-08-06 MED ORDER — KETOROLAC TROMETHAMINE 30 MG/ML IJ SOLN
30.0000 mg | Freq: Once | INTRAMUSCULAR | Status: AC
  Administered 2016-08-06: 30 mg via INTRAVENOUS

## 2016-08-06 MED ORDER — SUCCINYLCHOLINE CHLORIDE 200 MG/10ML IV SOSY
PREFILLED_SYRINGE | INTRAVENOUS | Status: AC
  Filled 2016-08-06: qty 10

## 2016-08-06 MED ORDER — SODIUM CHLORIDE 0.9 % IV SOLN
500.0000 mL | Freq: Once | INTRAVENOUS | Status: AC
  Administered 2016-08-06: 11:00:00 via INTRAVENOUS

## 2016-08-06 MED ORDER — METHOHEXITAL SODIUM 100 MG/10ML IV SOSY
PREFILLED_SYRINGE | INTRAVENOUS | Status: DC | PRN
  Administered 2016-08-06: 100 mg via INTRAVENOUS

## 2016-08-06 MED ORDER — MIDAZOLAM HCL 2 MG/2ML IJ SOLN
INTRAMUSCULAR | Status: DC | PRN
  Administered 2016-08-06: 2 mg via INTRAVENOUS

## 2016-08-06 MED ORDER — SODIUM CHLORIDE 0.9 % IV SOLN
500.0000 mL | Freq: Once | INTRAVENOUS | Status: AC
Start: 2016-08-06 — End: 2016-08-06
  Administered 2016-08-06: 09:00:00 via INTRAVENOUS

## 2016-08-06 MED ORDER — FENTANYL CITRATE (PF) 100 MCG/2ML IJ SOLN: 25.0000 ug | INTRAMUSCULAR | Status: DC | PRN

## 2016-08-06 MED ORDER — ONDANSETRON HCL 4 MG/2ML IJ SOLN: 4.0000 mg | Freq: Once | INTRAMUSCULAR | Status: DC | PRN

## 2016-08-06 MED ORDER — KETOROLAC TROMETHAMINE 30 MG/ML IJ SOLN
INTRAMUSCULAR | Status: AC
  Administered 2016-08-06: 30 mg via INTRAVENOUS
  Filled 2016-08-06: qty 1

## 2016-08-06 MED ORDER — SUCCINYLCHOLINE CHLORIDE 200 MG/10ML IV SOSY
PREFILLED_SYRINGE | INTRAVENOUS | Status: DC | PRN
  Administered 2016-08-06: 120 mg via INTRAVENOUS

## 2016-08-06 NOTE — H&P (Signed)
Matthew Romero is an 26 y.o. male.   Chief Complaint: Continues to be depressed HPI: History of recurrent severe depression without sustained response  Past Medical History:  Diagnosis Date  . ADHD (attention deficit hyperactivity disorder)   . Asthma   . Depression     Past Surgical History:  Procedure Laterality Date  . NO PAST SURGERIES      Family History  Problem Relation Age of Onset  . ADD / ADHD Mother   . Anxiety disorder Mother   . Depression Father   . ADD / ADHD Brother   . Depression Brother   . ADD / ADHD Brother   . Alcohol abuse Cousin   . Drug abuse Cousin    Social History:  reports that he has been smoking Cigars.  He has never used smokeless tobacco. He reports that he drinks about 1.8 oz of alcohol per week . He reports that he uses drugs, including Marijuana.  Allergies: No Known Allergies   (Not in a hospital admission)  No results found for this or any previous visit (from the past 48 hour(s)). No results found.  Review of Systems  Constitutional: Negative.   HENT: Negative.   Eyes: Negative.   Respiratory: Negative.   Cardiovascular: Negative.   Gastrointestinal: Negative.   Musculoskeletal: Negative.   Skin: Negative.   Neurological: Negative.   Psychiatric/Behavioral: Positive for depression and suicidal ideas. Negative for hallucinations, memory loss and substance abuse. The patient is not nervous/anxious and does not have insomnia.     Blood pressure 119/81, pulse 71, temperature 98.1 F (36.7 C), temperature source Oral, resp. rate 17, height 6\' 3"  (1.905 m), weight 95.7 kg (211 lb), SpO2 98 %. Physical Exam  Nursing note and vitals reviewed. Constitutional: He appears well-developed and well-nourished.  HENT:  Head: Normocephalic and atraumatic.  Eyes: Conjunctivae are normal. Pupils are equal, round, and reactive to light.  Neck: Normal range of motion.  Cardiovascular: Regular rhythm and normal heart sounds.    Respiratory: Breath sounds normal. No respiratory distress.  GI: Soft.  Musculoskeletal: Normal range of motion.  Neurological: He is alert.  Skin: Skin is warm and dry.  Psychiatric: Judgment normal. His affect is blunt. His speech is delayed. He is slowed. Cognition and memory are normal. He exhibits a depressed mood. He expresses suicidal ideation. He expresses no suicidal plans.     Assessment/Plan ECT today continue 3 times a week index course  Alethia Berthold, MD 08/06/2016, 10:42 AM

## 2016-08-06 NOTE — Discharge Instructions (Signed)
1)  The drugs that you have been given will stay in your system until tomorrow so for the       next 24 hours you should not:  A. Drive an automobile  B. Make any legal decisions  C. Drink any alcoholic beverages  2)  You may resume your regular meals upon return home.  3)  A responsible adult must take you home.  Someone should stay with you for a few          hours, then be available by phone for the remainder of the treatment day.  4)  You May experience any of the following symptoms:  Headache, Nausea and a dry mouth (due to the medications you were given),  temporary memory loss and some confusion, or sore muscles (a warm bath  should help this).  If you you experience any of these symptoms let us know on                your return visit.  5)  Report any of the following: any acute discomfort, severe headache, or temperature        greater than 100.5 F.   Also report any unusual redness, swelling, drainage, or pain         at your IV site.    You may report Symptoms to:  [place] [facility]          Phone: [phone], ECT Department           or Dr. Prescott Gum office [phone]  6)  Your next ECT Treatment is Day Friday Date February 9th 2018 at 830am  We will call 2 days prior to your scheduled appointment for arrival times.  7)  Nothing to eat or drink after midnight the night before your procedure.  8)  Take .   With a sip of water the morning of your procedure.  9)  Other Instructions: Call [phone] to cancel the morning of your procedure due         to illness or emergency.  10) We will call within 72 hours to assess how you are feeling.

## 2016-08-06 NOTE — Transfer of Care (Signed)
Immediate Anesthesia Transfer of Care Note  Patient: Matthew Romero  Procedure(s) Performed: ECT  Patient Location: PACU  Anesthesia Type:General  Level of Consciousness: sedated  Airway & Oxygen Therapy: Patient connected to face mask oxygen  Post-op Assessment: Post -op Vital signs reviewed and stable  Post vital signs: Reviewed and stable  Last Vitals:  Vitals:   08/06/16 0900 08/06/16 1100  BP: 119/81 113/67  Pulse: 71 81  Resp: 17 16  Temp: 36.7 C 36.1 C    Last Pain:  Vitals:   08/06/16 0900  TempSrc: Oral         Complications: No apparent anesthesia complications

## 2016-08-06 NOTE — Anesthesia Post-op Follow-up Note (Cosign Needed)
Anesthesia QCDR form completed.        

## 2016-08-06 NOTE — Anesthesia Preprocedure Evaluation (Signed)
Anesthesia Evaluation  Patient identified by MRN, date of birth, ID band Patient awake    Reviewed: Allergy & Precautions, H&P , NPO status , Patient's Chart, lab work & pertinent test results, reviewed documented beta blocker date and time   History of Anesthesia Complications (+) Family history of anesthesia reaction and history of anesthetic complications  Airway Mallampati: II  TM Distance: >3 FB Neck ROM: full    Dental  (+) Chipped, Dental Advidsory Given   Pulmonary neg shortness of breath, asthma , neg sleep apnea, neg COPD, neg recent URI, Current Smoker,    Pulmonary exam normal        Cardiovascular Exercise Tolerance: Good negative cardio ROS Normal cardiovascular exam     Neuro/Psych PSYCHIATRIC DISORDERS Depression negative neurological ROS     GI/Hepatic negative GI ROS, Neg liver ROS,   Endo/Other  negative endocrine ROS  Renal/GU negative Renal ROS  negative genitourinary   Musculoskeletal negative musculoskeletal ROS (+)   Abdominal Normal abdominal exam  (+)   Peds negative pediatric ROS (+)  Hematology negative hematology ROS (+)   Anesthesia Other Findings Past Medical History: No date: ADHD (attention deficit hyperactivity disorder) No date: Asthma No date: Depression   Reproductive/Obstetrics negative OB ROS                             Anesthesia Physical  Anesthesia Plan  ASA: II  Anesthesia Plan: General   Post-op Pain Management:    Induction:   Airway Management Planned: Mask  Additional Equipment:   Intra-op Plan:   Post-operative Plan:   Informed Consent: I have reviewed the patients History and Physical, chart, labs and discussed the procedure including the risks, benefits and alternatives for the proposed anesthesia with the patient or authorized representative who has indicated his/her understanding and acceptance.   Dental Advisory  Given  Plan Discussed with: Anesthesiologist, CRNA and Surgeon  Anesthesia Plan Comments:         Anesthesia Quick Evaluation

## 2016-08-06 NOTE — Anesthesia Postprocedure Evaluation (Signed)
Anesthesia Post Note  Patient: Matthew Romero  Procedure(s) Performed: * No procedures listed *  Patient location during evaluation: PACU Anesthesia Type: General Level of consciousness: awake and alert and oriented Pain management: pain level controlled Vital Signs Assessment: post-procedure vital signs reviewed and stable Respiratory status: spontaneous breathing Cardiovascular status: blood pressure returned to baseline Anesthetic complications: no     Last Vitals:  Vitals:   08/06/16 1140 08/06/16 1146  BP: 118/79   Pulse: (!) 59 79  Resp: 16   Temp:      Last Pain:  Vitals:   08/06/16 1130  TempSrc:   PainSc: 0-No pain                 Ondrea Dow

## 2016-08-06 NOTE — Procedures (Signed)
ECT SERVICES Physician's Interval Evaluation & Treatment Note  Patient Identification: Matthew Romero MRN:  [id number] Date of Evaluation:  08/06/2016 TX #: 2  MADRS:   MMSE:   P.E. Findings:  No change to physical exam vital signs unremarkable  Psychiatric Interval Note:  Mood still flat and depressed no psychosis  Subjective:  Patient is a 26 y.o. male seen for evaluation for Electroconvulsive Therapy. No specific new problem  Treatment Summary:   [x]   Right Unilateral             []  Bilateral   % Energy : 0.3 ms 30%   Impedance: 1630 ohms  Seizure Energy Index: 10,537 V squared  Postictal Suppression Index: 89%  Seizure Concordance Index: 97%  Medications  Pre Shock: Toradol 30 mg Brevital 100 mg succinylcholine 120 mg  Post Shock: Versed 2 mg  Seizure Duration: 36 seconds by EMG, 76 seconds by EEG   Comments: Follow-up Friday   Lungs:  [x]   Clear to auscultation               []  Other:   Heart:    [x]   Regular rhythm             []  irregular rhythm    [x]   Previous H&P reviewed, patient examined and there are NO CHANGES                 []   Previous H&P reviewed, patient examined and there are changes noted.   Alethia Berthold, MD 2/7/201810:44 AM

## 2016-08-07 ENCOUNTER — Telehealth (HOSPITAL_COMMUNITY): Payer: Self-pay | Admitting: *Deleted

## 2016-08-07 NOTE — Telephone Encounter (Signed)
Called for prior authorization of ECT. Was told by Matthew Romero that no authorization is required since hospital is in network with [facility]. Date of 08/01/16 and time stamp 3:35pm central standard time used as call reference number.

## 2016-08-08 ENCOUNTER — Encounter: Payer: Self-pay | Admitting: Anesthesiology

## 2016-08-08 ENCOUNTER — Telehealth: Payer: Self-pay

## 2016-08-08 ENCOUNTER — Ambulatory Visit
Admission: RE | Admit: 2016-08-08 | Discharge: 2016-08-08 | Disposition: A | Discharge status: Home / Self Care | Payer: 59 | Source: Ambulatory Visit | Attending: Psychiatry | Admitting: Psychiatry

## 2016-08-08 ENCOUNTER — Other Ambulatory Visit: Payer: Self-pay | Admitting: Psychiatry

## 2016-08-08 DIAGNOSIS — N200 Calculus of kidney: Secondary | ICD-10-CM | POA: Diagnosis not present

## 2016-08-08 DIAGNOSIS — F332 Major depressive disorder, recurrent severe without psychotic features: Secondary | ICD-10-CM

## 2016-08-08 DIAGNOSIS — F338 Other recurrent depressive disorders: Secondary | ICD-10-CM | POA: Diagnosis not present

## 2016-08-08 DIAGNOSIS — J45909 Unspecified asthma, uncomplicated: Secondary | ICD-10-CM | POA: Diagnosis not present

## 2016-08-08 MED ORDER — SUCCINYLCHOLINE CHLORIDE 200 MG/10ML IV SOSY
PREFILLED_SYRINGE | INTRAVENOUS | Status: AC
  Filled 2016-08-08: qty 10

## 2016-08-08 MED ORDER — SODIUM CHLORIDE 0.9 % IV SOLN
500.0000 mL | Freq: Once | INTRAVENOUS | Status: AC
  Administered 2016-08-08: 1000 mL via INTRAVENOUS

## 2016-08-08 MED ORDER — KETOROLAC TROMETHAMINE 30 MG/ML IJ SOLN
30.0000 mg | Freq: Once | INTRAMUSCULAR | Status: AC
  Administered 2016-08-08: 30 mg via INTRAVENOUS

## 2016-08-08 MED ORDER — MIDAZOLAM HCL 2 MG/2ML IJ SOLN
INTRAMUSCULAR | Status: DC | PRN
  Administered 2016-08-08: 2 mg via INTRAVENOUS

## 2016-08-08 MED ORDER — MIDAZOLAM HCL 2 MG/2ML IJ SOLN
INTRAMUSCULAR | Status: AC
  Filled 2016-08-08: qty 2

## 2016-08-08 MED ORDER — SODIUM CHLORIDE 0.9 % IV SOLN
INTRAVENOUS | Status: DC | PRN
  Administered 2016-08-08: 11:00:00 via INTRAVENOUS

## 2016-08-08 MED ORDER — KETOROLAC TROMETHAMINE 30 MG/ML IJ SOLN
INTRAMUSCULAR | Status: AC
  Filled 2016-08-08: qty 1

## 2016-08-08 MED ORDER — METHOHEXITAL SODIUM 100 MG/10ML IV SOSY
PREFILLED_SYRINGE | INTRAVENOUS | Status: DC | PRN
  Administered 2016-08-08: 100 mg via INTRAVENOUS

## 2016-08-08 MED ORDER — SUCCINYLCHOLINE CHLORIDE 200 MG/10ML IV SOSY
PREFILLED_SYRINGE | INTRAVENOUS | Status: DC | PRN
  Administered 2016-08-08: 120 mg via INTRAVENOUS

## 2016-08-08 NOTE — Discharge Instructions (Signed)
1)  The drugs that you have been given will stay in your system until tomorrow so for the       next 24 hours you should not:  A. Drive an automobile  B. Make any legal decisions  C. Drink any alcoholic beverages  2)  You may resume your regular meals upon return home.  3)  A responsible adult must take you home.  Someone should stay with you for a few          hours, then be available by phone for the remainder of the treatment day.  4)  You May experience any of the following symptoms:  Headache, Nausea and a dry mouth (due to the medications you were given),  temporary memory loss and some confusion, or sore muscles (a warm bath  should help this).  If you you experience any of these symptoms let us know on                your return visit.  5)  Report any of the following: any acute discomfort, severe headache, or temperature        greater than 100.5 F.   Also report any unusual redness, swelling, drainage, or pain         at your IV site.    You may report Symptoms to:  [place] [facility]          Phone: [phone], ECT Department           or Dr. Prescott Gum office [phone]  6)  Your next ECT Treatment is Day Monday  Date August 11, 2016  We will call 2 days prior to your scheduled appointment for arrival times.  7)  Nothing to eat or drink after midnight the night before your procedure.  8)  Take Rexalti, Fluoxitine, Duoxitine     With a sip of water the morning of your procedure.  9)  Other Instructions: Call [phone] to cancel the morning of your procedure due         to illness or emergency.  10) We will call within 72 hours to assess how you are feeling.

## 2016-08-08 NOTE — Procedures (Signed)
ECT SERVICES Physician's Interval Evaluation & Treatment Note  Patient Identification: Matthew Romero MRN:  [id number] Date of Evaluation:  08/08/2016 TX #: 3  MADRS:   MMSE:   P.E. Findings:  No change to physical exam. Heart and lungs normal. Thought unremarkable  Psychiatric Interval Note:  Mood possibly subjectively different. Does not appear to be significantly different. No complaints of any side effects  Subjective:  Patient is a 26 y.o. male seen for evaluation for Electroconvulsive Therapy. No specific new complaint  Treatment Summary:   [x]   Right Unilateral             []  Bilateral   % Energy : 0.3 ms 30%   Impedance: 1400 ohms  Seizure Energy Index: 35,115 V squared  Postictal Suppression Index:    Seizure Concordance Index: 65%  Medications  Pre Shock: Toradol 30 mg Brevital 100 mg succinylcholine 120 mg  Post Shock: Versed 2 mg  Seizure Duration: 38 seconds by EMG 98 seconds by EEG   Comments: Treatment into next week follow-up Monday   Lungs:  [x]   Clear to auscultation               []  Other:   Heart:    [x]   Regular rhythm             []  irregular rhythm    [x]   Previous H&P reviewed, patient examined and there are NO CHANGES                 []   Previous H&P reviewed, patient examined and there are changes noted.   Alethia Berthold, MD 2/9/201810:27 AM

## 2016-08-08 NOTE — Anesthesia Post-op Follow-up Note (Cosign Needed)
Anesthesia QCDR form completed.        

## 2016-08-08 NOTE — Transfer of Care (Signed)
Immediate Anesthesia Transfer of Care Note  Patient: Matthew Romero  Procedure(s) Performed: ECT  Patient Location: PACU  Anesthesia Type:General  Level of Consciousness: sedated  Airway & Oxygen Therapy: Patient Spontanous Breathing and Patient connected to face mask oxygen  Post-op Assessment: Report given to RN and Post -op Vital signs reviewed and stable  Post vital signs: Reviewed and stable  Last Vitals:  Vitals:   08/08/16 0907  BP: 122/62  Pulse: 62  Resp: 18  Temp: 36.4 C    Last Pain:  Vitals:   08/08/16 0907  TempSrc: Oral         Complications: No apparent anesthesia complications

## 2016-08-08 NOTE — Anesthesia Postprocedure Evaluation (Signed)
Anesthesia Post Note  Patient: Matthew Romero  Procedure(s) Performed: * No procedures listed *  Patient location during evaluation: PACU Anesthesia Type: General Level of consciousness: awake and alert Pain management: pain level controlled Vital Signs Assessment: post-procedure vital signs reviewed and stable Respiratory status: spontaneous breathing, nonlabored ventilation, respiratory function stable and patient connected to nasal cannula oxygen Cardiovascular status: blood pressure returned to baseline and stable Postop Assessment: no signs of nausea or vomiting Anesthetic complications: no     Last Vitals:  Vitals:   08/08/16 1205 08/08/16 1251  BP: 110/63   Pulse: 69   Resp: 15 15  Temp:  36.4 C    Last Pain:  Vitals:   08/08/16 1251  TempSrc: Oral  PainSc:                  Precious Haws Piscitello

## 2016-08-08 NOTE — Anesthesia Preprocedure Evaluation (Signed)
Anesthesia Evaluation  Patient identified by MRN, date of birth, ID band Patient awake    Reviewed: Allergy & Precautions, H&P , NPO status , Patient's Chart, lab work & pertinent test results, reviewed documented beta blocker date and time   History of Anesthesia Complications (+) Family history of anesthesia reaction and history of anesthetic complications  Airway Mallampati: II  TM Distance: >3 FB Neck ROM: full    Dental  (+) Chipped, Dental Advidsory Given, Poor Dentition   Pulmonary neg shortness of breath, asthma , neg sleep apnea, neg COPD, neg recent URI, Current Smoker,    Pulmonary exam normal        Cardiovascular Exercise Tolerance: Good negative cardio ROS Normal cardiovascular exam     Neuro/Psych PSYCHIATRIC DISORDERS Depression negative neurological ROS     GI/Hepatic negative GI ROS, Neg liver ROS,   Endo/Other  negative endocrine ROS  Renal/GU negative Renal ROS  negative genitourinary   Musculoskeletal negative musculoskeletal ROS (+)   Abdominal Normal abdominal exam  (+)   Peds negative pediatric ROS (+)  Hematology negative hematology ROS (+)   Anesthesia Other Findings Past Medical History: No date: ADHD (attention deficit hyperactivity disorder) No date: Asthma No date: Depression   Reproductive/Obstetrics negative OB ROS                             Anesthesia Physical  Anesthesia Plan  ASA: III  Anesthesia Plan: General   Post-op Pain Management:    Induction:   Airway Management Planned: Mask  Additional Equipment:   Intra-op Plan:   Post-operative Plan:   Informed Consent: I have reviewed the patients History and Physical, chart, labs and discussed the procedure including the risks, benefits and alternatives for the proposed anesthesia with the patient or authorized representative who has indicated his/her understanding and acceptance.    Dental Advisory Given  Plan Discussed with: Anesthesiologist, CRNA and Surgeon  Anesthesia Plan Comments:         Anesthesia Quick Evaluation

## 2016-08-08 NOTE — H&P (Signed)
Matthew Romero is an 26 y.o. male.   Chief Complaint: Continues depressed subjectively slightly better HPI: History of major depression recurrent continue with this. Starting ECT patient is receiving ECT  Past Medical History:  Diagnosis Date  . ADHD (attention deficit hyperactivity disorder)   . Asthma   . Depression     Past Surgical History:  Procedure Laterality Date  . NO PAST SURGERIES      Family History  Problem Relation Age of Onset  . ADD / ADHD Mother   . Anxiety disorder Mother   . Depression Father   . ADD / ADHD Brother   . Depression Brother   . ADD / ADHD Brother   . Alcohol abuse Cousin   . Drug abuse Cousin    Social History:  reports that he has been smoking Cigars.  He has never used smokeless tobacco. He reports that he drinks about 1.8 oz of alcohol per week . He reports that he uses drugs, including Marijuana.  Allergies: No Known Allergies   (Not in a hospital admission)  No results found for this or any previous visit (from the past 48 hour(s)). No results found.  Review of Systems  Constitutional: Negative.   HENT: Negative.   Eyes: Negative.   Respiratory: Negative.   Cardiovascular: Negative.   Gastrointestinal: Negative.   Musculoskeletal: Negative.   Skin: Negative.   Neurological: Negative.   Psychiatric/Behavioral: Positive for depression.    Blood pressure 122/62, pulse 62, temperature 97.5 F (36.4 C), temperature source Oral, resp. rate 18, height 6\' 3"  (1.905 m), weight 97.5 kg (215 lb), SpO2 98 %. Physical Exam  Nursing note and vitals reviewed. Constitutional: He appears well-developed and well-nourished.  HENT:  Head: Normocephalic and atraumatic.  Eyes: Conjunctivae are normal. Pupils are equal, round, and reactive to light.  Neck: Normal range of motion.  Cardiovascular: Regular rhythm and normal heart sounds.   Respiratory: Effort normal and breath sounds normal. No respiratory distress.  GI: Soft.   Musculoskeletal: Normal range of motion.  Neurological: He is alert.  Skin: Skin is warm and dry.  Psychiatric: His behavior is normal. Judgment and thought content normal. He exhibits a depressed mood.     Assessment/Plan Asian is receiving ECT continue into next week 3 times week schedule  Alethia Berthold, MD 08/08/2016, 10:26 AM

## 2016-08-08 NOTE — Anesthesia Procedure Notes (Signed)
Date/Time: 08/08/2016 11:28 AM Performed by: Dionne Bucy Pre-anesthesia Checklist: Patient identified, Emergency Drugs available, Suction available and Patient being monitored Patient Re-evaluated:Patient Re-evaluated prior to inductionOxygen Delivery Method: Circle system utilized Preoxygenation: Pre-oxygenation with 100% oxygen Intubation Type: IV induction Ventilation: Mask ventilation without difficulty and Mask ventilation throughout procedure Airway Equipment and Method: Bite block Placement Confirmation: positive ETCO2 Dental Injury: Teeth and Oropharynx as per pre-operative assessment

## 2016-08-11 ENCOUNTER — Encounter (HOSPITAL_BASED_OUTPATIENT_CLINIC_OR_DEPARTMENT_OTHER)
Admission: RE | Admit: 2016-08-11 | Discharge: 2016-08-11 | Disposition: A | Discharge status: Home / Self Care | Payer: 59 | Source: Ambulatory Visit | Attending: Psychiatry | Admitting: Psychiatry

## 2016-08-11 ENCOUNTER — Other Ambulatory Visit: Payer: Self-pay | Admitting: Psychiatry

## 2016-08-11 ENCOUNTER — Encounter: Payer: Self-pay | Admitting: Certified Registered Nurse Anesthetist

## 2016-08-11 DIAGNOSIS — Z811 Family history of alcohol abuse and dependence: Secondary | ICD-10-CM | POA: Diagnosis not present

## 2016-08-11 DIAGNOSIS — F332 Major depressive disorder, recurrent severe without psychotic features: Secondary | ICD-10-CM

## 2016-08-11 DIAGNOSIS — Z818 Family history of other mental and behavioral disorders: Secondary | ICD-10-CM | POA: Diagnosis not present

## 2016-08-11 DIAGNOSIS — F17290 Nicotine dependence, other tobacco product, uncomplicated: Secondary | ICD-10-CM | POA: Diagnosis not present

## 2016-08-11 DIAGNOSIS — F901 Attention-deficit hyperactivity disorder, predominantly hyperactive type: Secondary | ICD-10-CM | POA: Diagnosis not present

## 2016-08-11 DIAGNOSIS — F338 Other recurrent depressive disorders: Secondary | ICD-10-CM | POA: Diagnosis not present

## 2016-08-11 DIAGNOSIS — J45909 Unspecified asthma, uncomplicated: Secondary | ICD-10-CM | POA: Diagnosis not present

## 2016-08-11 DIAGNOSIS — F909 Attention-deficit hyperactivity disorder, unspecified type: Secondary | ICD-10-CM | POA: Diagnosis not present

## 2016-08-11 MED ORDER — SODIUM CHLORIDE 0.9 % IV SOLN
INTRAVENOUS | Status: DC | PRN
  Administered 2016-08-11: 10:00:00 via INTRAVENOUS

## 2016-08-11 MED ORDER — METHOHEXITAL SODIUM 100 MG/10ML IV SOSY
PREFILLED_SYRINGE | INTRAVENOUS | Status: DC | PRN
  Administered 2016-08-11: 100 mg via INTRAVENOUS

## 2016-08-11 MED ORDER — MIDAZOLAM HCL 2 MG/2ML IJ SOLN
INTRAMUSCULAR | Status: DC | PRN
  Administered 2016-08-11: 2 mg via INTRAVENOUS

## 2016-08-11 MED ORDER — SODIUM CHLORIDE 0.9 % IV SOLN
500.0000 mL | Freq: Once | INTRAVENOUS | Status: AC
  Administered 2016-08-11: 09:00:00 via INTRAVENOUS

## 2016-08-11 MED ORDER — SUCCINYLCHOLINE CHLORIDE 20 MG/ML IJ SOLN
INTRAMUSCULAR | Status: DC | PRN
  Administered 2016-08-11: 120 mg via INTRAVENOUS

## 2016-08-11 MED ORDER — SUCCINYLCHOLINE CHLORIDE 200 MG/10ML IV SOSY
PREFILLED_SYRINGE | INTRAVENOUS | Status: AC
  Filled 2016-08-11: qty 10

## 2016-08-11 MED ORDER — METHOHEXITAL SODIUM 0.5 G IJ SOLR
INTRAMUSCULAR | Status: AC
  Filled 2016-08-11: qty 500

## 2016-08-11 MED ORDER — KETOROLAC TROMETHAMINE 30 MG/ML IJ SOLN
INTRAMUSCULAR | Status: AC
  Filled 2016-08-11: qty 1

## 2016-08-11 MED ORDER — MIDAZOLAM HCL 2 MG/2ML IJ SOLN
INTRAMUSCULAR | Status: AC
  Filled 2016-08-11: qty 2

## 2016-08-11 MED ORDER — KETOROLAC TROMETHAMINE 30 MG/ML IJ SOLN
30.0000 mg | Freq: Once | INTRAMUSCULAR | Status: AC
  Administered 2016-08-11: 30 mg via INTRAVENOUS

## 2016-08-11 NOTE — Transfer of Care (Signed)
Immediate Anesthesia Transfer of Care Note  Patient: Matthew Romero  Procedure(s) Performed: * No procedures listed *  Patient Location: PACU  Anesthesia Type:General  Level of Consciousness: sedated  Airway & Oxygen Therapy: Patient Spontanous Breathing and Patient connected to face mask oxygen  Post-op Assessment: Report given to RN and Post -op Vital signs reviewed and stable  Post vital signs: Reviewed and stable  Last Vitals:  Vitals:   08/11/16 0839  BP: 127/73  Pulse: 71  Resp: 16  Temp: 36.7 C    Last Pain:  Vitals:   08/11/16 0839  TempSrc: Oral         Complications: No apparent anesthesia complications

## 2016-08-11 NOTE — Anesthesia Preprocedure Evaluation (Signed)
Anesthesia Evaluation  Patient identified by MRN, date of birth, ID band Patient awake    Reviewed: Allergy & Precautions, H&P , NPO status , Patient's Chart, lab work & pertinent test results, reviewed documented beta blocker date and time   History of Anesthesia Complications (+) Family history of anesthesia reactionNegative for: history of anesthetic complications  Airway Mallampati: II  TM Distance: >3 FB Neck ROM: full    Dental  (+) Chipped, Dental Advidsory Given, Poor Dentition   Pulmonary neg shortness of breath, asthma , neg sleep apnea, neg COPD, neg recent URI, Current Smoker,    Pulmonary exam normal breath sounds clear to auscultation- rhonchi (-) wheezing      Cardiovascular Exercise Tolerance: Good (-) hypertension(-) CAD and (-) Past MI negative cardio ROS Normal cardiovascular exam Rhythm:Regular Rate:Normal - Systolic murmurs and - Diastolic murmurs    Neuro/Psych PSYCHIATRIC DISORDERS Depression negative neurological ROS     GI/Hepatic negative GI ROS, Neg liver ROS,   Endo/Other  negative endocrine ROSneg diabetes  Renal/GU negative Renal ROS  negative genitourinary   Musculoskeletal negative musculoskeletal ROS (+)   Abdominal Normal abdominal exam  (+) - obese,   Peds negative pediatric ROS (+)  Hematology negative hematology ROS (+)   Anesthesia Other Findings Past Medical History: No date: ADHD (attention deficit hyperactivity disorder) No date: Asthma No date: Depression   Reproductive/Obstetrics negative OB ROS                             Anesthesia Physical  Anesthesia Plan  ASA: III  Anesthesia Plan: General   Post-op Pain Management:    Induction:   Airway Management Planned: Mask  Additional Equipment:   Intra-op Plan:   Post-operative Plan:   Informed Consent: I have reviewed the patients History and Physical, chart, labs and discussed  the procedure including the risks, benefits and alternatives for the proposed anesthesia with the patient or authorized representative who has indicated his/her understanding and acceptance.   Dental Advisory Given  Plan Discussed with: Anesthesiologist and CRNA  Anesthesia Plan Comments:         Anesthesia Quick Evaluation

## 2016-08-11 NOTE — Procedures (Signed)
ECT SERVICES Physician's Interval Evaluation & Treatment Note  Patient Identification: Matthew Romero MRN:  [id number] Date of Evaluation:  08/11/2016 TX #: 4  MADRS: 30  MMSE: 29  P.E. Findings:  No change to physical exam vitals are unremarkable  Psychiatric Interval Note:  No report of any particular change  Subjective:  Patient is a 26 y.o. male seen for evaluation for Electroconvulsive Therapy. No change  Treatment Summary:   [x]   Right Unilateral             []  Bilateral   % Energy : 0.3 ms 30%   Impedance: 1830 ohms  Seizure Energy Index: 20,546 V squared  Postictal Suppression Index: 33% which is probably an inaccurate reading  Seizure Concordance Index: 87%  Medications  Pre Shock: Toradol 30 mg, Brevital 100 mg, succinylcholine 120 mg  Post Shock: Versed to milligrams  Seizure Duration: 41 seconds by EMG, computer read 65 seconds by EEG which is not correct. Seizure actually ended somewhere between 112 in 150 seconds   Comments: Follow-up Wednesday   Lungs:  [x]   Clear to auscultation               []  Other:   Heart:    [x]   Regular rhythm             []  irregular rhythm    [x]   Previous H&P reviewed, patient examined and there are NO CHANGES                 []   Previous H&P reviewed, patient examined and there are changes noted.   Alethia Berthold, MD 2/12/201810:10 AM

## 2016-08-11 NOTE — Anesthesia Procedure Notes (Signed)
Date/Time: 08/11/2016 10:08 AM Performed by: Johnna Acosta Pre-anesthesia Checklist: Patient identified, Emergency Drugs available, Suction available, Patient being monitored and Timeout performed Patient Re-evaluated:Patient Re-evaluated prior to inductionOxygen Delivery Method: Circle system utilized Preoxygenation: Pre-oxygenation with 100% oxygen Ventilation: Mask ventilation without difficulty

## 2016-08-11 NOTE — Discharge Instructions (Signed)
1)  The drugs that you have been given will stay in your system until tomorrow so for the       next 24 hours you should not:  A. Drive an automobile  B. Make any legal decisions  C. Drink any alcoholic beverages  2)  You may resume your regular meals upon return home.  3)  A responsible adult must take you home.  Someone should stay with you for a few          hours, then be available by phone for the remainder of the treatment day.  4)  You May experience any of the following symptoms:  Headache, Nausea and a dry mouth (due to the medications you were given),  temporary memory loss and some confusion, or sore muscles (a warm bath  should help this).  If you you experience any of these symptoms let us know on                your return visit.  5)  Report any of the following: any acute discomfort, severe headache, or temperature        greater than 100.5 F.   Also report any unusual redness, swelling, drainage, or pain         at your IV site.    You may report Symptoms to:  [place] [facility]          Phone: [phone], ECT Department           or Dr. Prescott Gum office [phone]  6)  Your next ECT Treatment is Day Wednesday  Date August 13, 2016  We will call 2 days prior to your scheduled appointment for arrival times.  7)  Nothing to eat or drink after midnight the night before your procedure.  8)  Take Rexulti, Duloxitine, Fluoxitine     With a sip of water the morning of your procedure.  9)  Other Instructions: Call [phone] to cancel the morning of your procedure due         to illness or emergency.  10) We will call within 72 hours to assess how you are feeling.

## 2016-08-11 NOTE — Anesthesia Postprocedure Evaluation (Signed)
Anesthesia Post Note  Patient: Matthew Romero  Procedure(s) Performed: * No procedures listed *  Patient location during evaluation: PACU Anesthesia Type: General Level of consciousness: awake and alert Pain management: pain level controlled Vital Signs Assessment: post-procedure vital signs reviewed and stable Respiratory status: spontaneous breathing, nonlabored ventilation and respiratory function stable Cardiovascular status: blood pressure returned to baseline and stable Postop Assessment: no signs of nausea or vomiting Anesthetic complications: no     Last Vitals:  Vitals:   08/11/16 1107 08/11/16 1118  BP: 123/80 127/88  Pulse: (!) 58 75  Resp: 14 16  Temp: 36.4 C     Last Pain:  Vitals:   08/11/16 1118  TempSrc:   PainSc: 0-No pain                 Suvan Stcyr

## 2016-08-11 NOTE — H&P (Signed)
Matthew Romero is an 26 y.o. male.   Chief Complaint: Going depression not any worse. No noticeable memory problems. No other physical complaints HPI: Severe major depression recurrent without response to medication currently in the midst of index ECT  Past Medical History:  Diagnosis Date  . ADHD (attention deficit hyperactivity disorder)   . Asthma   . Depression     Past Surgical History:  Procedure Laterality Date  . NO PAST SURGERIES      Family History  Problem Relation Age of Onset  . ADD / ADHD Mother   . Anxiety disorder Mother   . Depression Father   . ADD / ADHD Brother   . Depression Brother   . ADD / ADHD Brother   . Alcohol abuse Cousin   . Drug abuse Cousin    Social History:  reports that he has been smoking Cigars.  He has never used smokeless tobacco. He reports that he drinks about 1.8 oz of alcohol per week . He reports that he uses drugs, including Marijuana.  Allergies: No Known Allergies   (Not in a hospital admission)  No results found for this or any previous visit (from the past 48 hour(s)). No results found.  Review of Systems  Constitutional: Negative.   HENT: Negative.   Eyes: Negative.   Respiratory: Negative.   Cardiovascular: Negative.   Gastrointestinal: Negative.   Musculoskeletal: Negative.   Skin: Negative.   Neurological: Negative.   Psychiatric/Behavioral: Negative.     Blood pressure 127/73, pulse 71, temperature 98 F (36.7 C), temperature source Oral, resp. rate 16, height 6\' 3"  (1.905 m), weight 98 kg (216 lb), SpO2 98 %. Physical Exam  Nursing note and vitals reviewed. Constitutional: He appears well-developed and well-nourished.  HENT:  Head: Normocephalic and atraumatic.  Eyes: Conjunctivae are normal. Pupils are equal, round, and reactive to light.  Neck: Normal range of motion.  Cardiovascular: Normal rate, regular rhythm and normal heart sounds.   Respiratory: Effort normal and breath sounds normal.   GI: Soft.  Musculoskeletal: Normal range of motion.  Neurological: He is alert.  Skin: Skin is warm and dry.  Psychiatric: He has a normal mood and affect. His behavior is normal. Judgment and thought content normal.     Assessment/Plan Treatment #4 today continue 3 times a week schedule  Alethia Berthold, MD 08/11/2016, 10:09 AM

## 2016-08-11 NOTE — Anesthesia Post-op Follow-up Note (Cosign Needed)
Anesthesia QCDR form completed.        

## 2016-08-12 ENCOUNTER — Other Ambulatory Visit: Payer: Self-pay | Admitting: Family Medicine

## 2016-08-12 MED ORDER — OSELTAMIVIR PHOSPHATE 75 MG PO CAPS: 75.0000 mg | ORAL_CAPSULE | Freq: Two times a day (BID) | ORAL | 0 refills | Status: AC

## 2016-08-13 ENCOUNTER — Other Ambulatory Visit: Payer: Self-pay | Admitting: Psychiatry

## 2016-08-13 ENCOUNTER — Encounter: Payer: Self-pay | Admitting: Anesthesiology

## 2016-08-13 ENCOUNTER — Encounter (HOSPITAL_BASED_OUTPATIENT_CLINIC_OR_DEPARTMENT_OTHER)
Admission: RE | Admit: 2016-08-13 | Discharge: 2016-08-13 | Disposition: A | Discharge status: Home / Self Care | Payer: 59 | Source: Ambulatory Visit | Attending: Psychiatry | Admitting: Psychiatry

## 2016-08-13 DIAGNOSIS — Z811 Family history of alcohol abuse and dependence: Secondary | ICD-10-CM | POA: Diagnosis not present

## 2016-08-13 DIAGNOSIS — F332 Major depressive disorder, recurrent severe without psychotic features: Secondary | ICD-10-CM

## 2016-08-13 DIAGNOSIS — J45909 Unspecified asthma, uncomplicated: Secondary | ICD-10-CM | POA: Diagnosis not present

## 2016-08-13 DIAGNOSIS — F338 Other recurrent depressive disorders: Secondary | ICD-10-CM | POA: Diagnosis not present

## 2016-08-13 DIAGNOSIS — F17290 Nicotine dependence, other tobacco product, uncomplicated: Secondary | ICD-10-CM | POA: Diagnosis not present

## 2016-08-13 DIAGNOSIS — Z818 Family history of other mental and behavioral disorders: Secondary | ICD-10-CM | POA: Diagnosis not present

## 2016-08-13 DIAGNOSIS — F909 Attention-deficit hyperactivity disorder, unspecified type: Secondary | ICD-10-CM | POA: Diagnosis not present

## 2016-08-13 MED ORDER — KETOROLAC TROMETHAMINE 30 MG/ML IJ SOLN
30.0000 mg | Freq: Once | INTRAMUSCULAR | Status: AC
  Administered 2016-08-13: 30 mg via INTRAVENOUS

## 2016-08-13 MED ORDER — SODIUM CHLORIDE 0.9 % IV SOLN
INTRAVENOUS | Status: DC | PRN
  Administered 2016-08-13: 11:00:00 via INTRAVENOUS

## 2016-08-13 MED ORDER — METHOHEXITAL SODIUM 100 MG/10ML IV SOSY
PREFILLED_SYRINGE | INTRAVENOUS | Status: DC | PRN
  Administered 2016-08-13: 100 mg via INTRAVENOUS

## 2016-08-13 MED ORDER — MIDAZOLAM HCL 2 MG/2ML IJ SOLN
INTRAMUSCULAR | Status: DC | PRN
  Administered 2016-08-13: 2 mg via INTRAVENOUS

## 2016-08-13 MED ORDER — SUCCINYLCHOLINE CHLORIDE 200 MG/10ML IV SOSY
PREFILLED_SYRINGE | INTRAVENOUS | Status: AC
  Filled 2016-08-13: qty 10

## 2016-08-13 MED ORDER — MIDAZOLAM HCL 2 MG/2ML IJ SOLN
INTRAMUSCULAR | Status: AC
  Filled 2016-08-13: qty 2

## 2016-08-13 MED ORDER — SUCCINYLCHOLINE CHLORIDE 200 MG/10ML IV SOSY
PREFILLED_SYRINGE | INTRAVENOUS | Status: DC | PRN
  Administered 2016-08-13: 120 mg via INTRAVENOUS

## 2016-08-13 MED ORDER — ONDANSETRON HCL 4 MG/2ML IJ SOLN: 4.0000 mg | Freq: Once | INTRAMUSCULAR | Status: DC | PRN

## 2016-08-13 MED ORDER — KETOROLAC TROMETHAMINE 30 MG/ML IJ SOLN
INTRAMUSCULAR | Status: AC
  Administered 2016-08-13: 30 mg via INTRAVENOUS
  Filled 2016-08-13: qty 1

## 2016-08-13 MED ORDER — SODIUM CHLORIDE 0.9 % IV SOLN
500.0000 mL | Freq: Once | INTRAVENOUS | Status: AC
  Administered 2016-08-13: 09:00:00 via INTRAVENOUS

## 2016-08-13 MED ORDER — FENTANYL CITRATE (PF) 100 MCG/2ML IJ SOLN: 25.0000 ug | INTRAMUSCULAR | Status: DC | PRN

## 2016-08-13 NOTE — Transfer of Care (Signed)
Immediate Anesthesia Transfer of Care Note  Patient: Matthew Romero  Procedure(s) Performed: ECT  Patient Location: PACU  Anesthesia Type:General  Level of Consciousness: sedated  Airway & Oxygen Therapy: Patient Spontanous Breathing and Patient connected to face mask oxygen  Post-op Assessment: Report given to RN and Post -op Vital signs reviewed and stable  Post vital signs: Reviewed and stable  Last Vitals:  Vitals:   08/13/16 0900 08/13/16 1052  BP: 135/79 128/76  Pulse: 74 77  Resp: 16 12  Temp: 36.4 C 36.7 C    Last Pain:  Vitals:   08/13/16 0900  TempSrc: Oral         Complications: No apparent anesthesia complications

## 2016-08-13 NOTE — Procedures (Signed)
ECT SERVICES Physician's Interval Evaluation & Treatment Note  Patient Identification: AKHIL EDELL MRN:  [id number] Date of Evaluation:  08/13/2016 TX #: 5  MADRS:   MMSE:   P.E. Findings:  Change to physical exam  Psychiatric Interval Note:  Mood slightly improved subjectively  Subjective:  Patient is a 26 y.o. male seen for evaluation for Electroconvulsive Therapy. No specific new complaint  Treatment Summary:   [x]   Right Unilateral             []  Bilateral   % Energy : 0.3 ms 30%   Impedance: 1280 ohms  Seizure Energy Index: 5403 V squared  Postictal Suppression Index: 74%  Seizure Concordance Index: 97%  Medications  Pre Shock: Toradol 30 mg Brevital 100 mg succinylcholine 120 mg  Post Shock: Versed 2 mg  Seizure Duration: 44 seconds by EMG, 133 seconds by EEG   Comments: Follow-up Friday   Lungs:  [x]   Clear to auscultation               []  Other:   Heart:    [x]   Regular rhythm             []  irregular rhythm    [x]   Previous H&P reviewed, patient examined and there are NO CHANGES                 []   Previous H&P reviewed, patient examined and there are changes noted.   Alethia Berthold, MD 2/14/201810:34 AM

## 2016-08-13 NOTE — Anesthesia Postprocedure Evaluation (Signed)
Anesthesia Post Note  Patient: Matthew Romero  Procedure(s) Performed: * No procedures listed *  Patient location during evaluation: PACU Anesthesia Type: General Level of consciousness: awake Pain management: pain level controlled Respiratory status: spontaneous breathing Cardiovascular status: stable Anesthetic complications: no     Last Vitals:  Vitals:   08/13/16 1122 08/13/16 1125  BP: 116/63 129/89  Pulse: 78 76  Resp: 15 16  Temp: 36.6 C 36.6 C    Last Pain:  Vitals:   08/13/16 1125  TempSrc: Oral  PainSc:                  VAN STAVEREN,Jovanna Hodges

## 2016-08-13 NOTE — Anesthesia Preprocedure Evaluation (Signed)
Anesthesia Evaluation  Patient identified by MRN, date of birth, ID band Patient awake    Reviewed: Allergy & Precautions, NPO status , Patient's Chart, lab work & pertinent test results  Airway Mallampati: I       Dental  (+) Teeth Intact   Pulmonary Current Smoker,    breath sounds clear to auscultation       Cardiovascular Exercise Tolerance: Good  Rhythm:Regular Rate:Normal     Neuro/Psych Depression    GI/Hepatic negative GI ROS, Neg liver ROS,   Endo/Other  negative endocrine ROS  Renal/GU negative Renal ROS     Musculoskeletal negative musculoskeletal ROS (+)   Abdominal Normal abdominal exam  (+)   Peds  Hematology negative hematology ROS (+)   Anesthesia Other Findings   Reproductive/Obstetrics negative OB ROS                             Anesthesia Physical Anesthesia Plan  ASA: II  Anesthesia Plan: General   Post-op Pain Management:    Induction: Intravenous  Airway Management Planned: Mask  Additional Equipment:   Intra-op Plan:   Post-operative Plan:   Informed Consent: I have reviewed the patients History and Physical, chart, labs and discussed the procedure including the risks, benefits and alternatives for the proposed anesthesia with the patient or authorized representative who has indicated his/her understanding and acceptance.     Plan Discussed with: CRNA  Anesthesia Plan Comments:         Anesthesia Quick Evaluation

## 2016-08-13 NOTE — Anesthesia Post-op Follow-up Note (Cosign Needed)
Anesthesia QCDR form completed.        

## 2016-08-13 NOTE — Anesthesia Procedure Notes (Signed)
Date/Time: 08/13/2016 10:41 AM Performed by: Dionne Bucy Pre-anesthesia Checklist: Patient identified, Emergency Drugs available, Suction available and Patient being monitored Patient Re-evaluated:Patient Re-evaluated prior to inductionOxygen Delivery Method: Circle system utilized Preoxygenation: Pre-oxygenation with 100% oxygen Intubation Type: IV induction Ventilation: Mask ventilation without difficulty and Mask ventilation throughout procedure Airway Equipment and Method: Bite block Placement Confirmation: positive ETCO2 Dental Injury: Teeth and Oropharynx as per pre-operative assessment

## 2016-08-13 NOTE — Discharge Instructions (Signed)
1)  The drugs that you have been given will stay in your system until tomorrow so for the       next 24 hours you should not:  A. Drive an automobile  B. Make any legal decisions  C. Drink any alcoholic beverages  2)  You may resume your regular meals upon return home.  3)  A responsible adult must take you home.  Someone should stay with you for a few          hours, then be available by phone for the remainder of the treatment day.  4)  You May experience any of the following symptoms:  Headache, Nausea and a dry mouth (due to the medications you were given),  temporary memory loss and some confusion, or sore muscles (a warm bath  should help this).  If you you experience any of these symptoms let us know on                your return visit.  5)  Report any of the following: any acute discomfort, severe headache, or temperature        greater than 100.5 F.   Also report any unusual redness, swelling, drainage, or pain         at your IV site.    You may report Symptoms to:  [place] at [facility]          Phone: [phone], ECT Department           or Dr. Prescott Gum office [phone]  6)  Your next ECT Treatment is Day Friday  Date August 17 2015 815am  We will call 2 days prior to your scheduled appointment for arrival times.  7)  Nothing to eat or drink after midnight the night before your procedure.  8)  Take .    With a sip of water the morning of your procedure.  9)  Other Instructions: Call [phone] to cancel the morning of your procedure due         to illness or emergency.  10) We will call within 72 hours to assess how you are feeling.

## 2016-08-13 NOTE — H&P (Signed)
Matthew Romero is an 26 y.o. male.   Chief Complaint: Patient continues to feel depressed possible slight subjective improvement no side effects noted no physical problems HPI: History of recurrent severe depression currently getting index treatment with ECT  Past Medical History:  Diagnosis Date  . ADHD (attention deficit hyperactivity disorder)   . Asthma   . Depression     Past Surgical History:  Procedure Laterality Date  . NO PAST SURGERIES      Family History  Problem Relation Age of Onset  . ADD / ADHD Mother   . Anxiety disorder Mother   . Depression Father   . ADD / ADHD Brother   . Depression Brother   . ADD / ADHD Brother   . Alcohol abuse Cousin   . Drug abuse Cousin    Social History:  reports that he has been smoking Cigars.  He has never used smokeless tobacco. He reports that he drinks about 1.8 oz of alcohol per week . He reports that he uses drugs, including Marijuana.  Allergies: No Known Allergies   (Not in a hospital admission)  No results found for this or any previous visit (from the past 48 hour(s)). No results found.  Review of Systems  Constitutional: Negative.   HENT: Negative.   Eyes: Negative.   Respiratory: Negative.   Cardiovascular: Negative.   Gastrointestinal: Negative.   Musculoskeletal: Negative.   Skin: Negative.   Neurological: Negative.   Psychiatric/Behavioral: Positive for depression. Negative for hallucinations, memory loss, substance abuse and suicidal ideas. The patient is not nervous/anxious and does not have insomnia.     Blood pressure 135/79, pulse 74, temperature 97.6 F (36.4 C), temperature source Oral, resp. rate 16, height 6\' 3"  (1.905 m), weight 96.2 kg (212 lb), SpO2 100 %. Physical Exam  Nursing note and vitals reviewed. Constitutional: He appears well-developed and well-nourished.  HENT:  Head: Normocephalic and atraumatic.  Eyes: Conjunctivae are normal. Pupils are equal, round, and reactive to  light.  Neck: Normal range of motion.  Cardiovascular: Regular rhythm and normal heart sounds.   Respiratory: Effort normal. No respiratory distress.  GI: Soft.  Musculoskeletal: Normal range of motion.  Neurological: He is alert.  Skin: Skin is warm and dry.  Psychiatric: Judgment and thought content normal. His speech is delayed. He is slowed. Cognition and memory are normal. He exhibits a depressed mood.     Assessment/Plan Right unilateral treatment today and continue 3 times a week schedule with continued evaluation of improvement  Alethia Berthold, MD 08/13/2016, 10:32 AM

## 2016-08-15 ENCOUNTER — Ambulatory Visit
Admission: RE | Admit: 2016-08-15 | Discharge: 2016-08-15 | Disposition: A | Discharge status: Home / Self Care | Payer: 59 | Source: Ambulatory Visit | Attending: Psychiatry | Admitting: Psychiatry

## 2016-08-15 ENCOUNTER — Other Ambulatory Visit: Payer: Self-pay | Admitting: Psychiatry

## 2016-08-15 ENCOUNTER — Encounter: Payer: Self-pay | Admitting: Anesthesiology

## 2016-08-15 DIAGNOSIS — F332 Major depressive disorder, recurrent severe without psychotic features: Secondary | ICD-10-CM

## 2016-08-15 DIAGNOSIS — F909 Attention-deficit hyperactivity disorder, unspecified type: Secondary | ICD-10-CM | POA: Insufficient documentation

## 2016-08-15 DIAGNOSIS — J45909 Unspecified asthma, uncomplicated: Secondary | ICD-10-CM | POA: Diagnosis not present

## 2016-08-15 DIAGNOSIS — Z818 Family history of other mental and behavioral disorders: Secondary | ICD-10-CM | POA: Diagnosis not present

## 2016-08-15 DIAGNOSIS — F17290 Nicotine dependence, other tobacco product, uncomplicated: Secondary | ICD-10-CM | POA: Insufficient documentation

## 2016-08-15 DIAGNOSIS — F329 Major depressive disorder, single episode, unspecified: Secondary | ICD-10-CM | POA: Diagnosis not present

## 2016-08-15 DIAGNOSIS — Z811 Family history of alcohol abuse and dependence: Secondary | ICD-10-CM | POA: Diagnosis not present

## 2016-08-15 DIAGNOSIS — Z8489 Family history of other specified conditions: Secondary | ICD-10-CM | POA: Insufficient documentation

## 2016-08-15 MED ORDER — FENTANYL CITRATE (PF) 100 MCG/2ML IJ SOLN: 25.0000 ug | INTRAMUSCULAR | Status: DC | PRN

## 2016-08-15 MED ORDER — SODIUM CHLORIDE 0.9 % IV SOLN
INTRAVENOUS | Status: DC | PRN
  Administered 2016-08-15: 11:00:00 via INTRAVENOUS

## 2016-08-15 MED ORDER — SUCCINYLCHOLINE CHLORIDE 200 MG/10ML IV SOSY
PREFILLED_SYRINGE | INTRAVENOUS | Status: AC
  Filled 2016-08-15: qty 10

## 2016-08-15 MED ORDER — METHOHEXITAL SODIUM 100 MG/10ML IV SOSY
PREFILLED_SYRINGE | INTRAVENOUS | Status: DC | PRN
  Administered 2016-08-15: 100 mg via INTRAVENOUS

## 2016-08-15 MED ORDER — ONDANSETRON HCL 4 MG/2ML IJ SOLN: 4.0000 mg | Freq: Once | INTRAMUSCULAR | Status: DC | PRN

## 2016-08-15 MED ORDER — KETOROLAC TROMETHAMINE 30 MG/ML IJ SOLN
INTRAMUSCULAR | Status: AC
  Administered 2016-08-15: 30 mg via INTRAVENOUS
  Filled 2016-08-15: qty 1

## 2016-08-15 MED ORDER — SODIUM CHLORIDE 0.9 % IV SOLN
500.0000 mL | Freq: Once | INTRAVENOUS | Status: AC
  Administered 2016-08-15: 1000 mL via INTRAVENOUS

## 2016-08-15 MED ORDER — MIDAZOLAM HCL 2 MG/2ML IJ SOLN
INTRAMUSCULAR | Status: DC | PRN
  Administered 2016-08-15: 2 mg via INTRAVENOUS

## 2016-08-15 MED ORDER — SUCCINYLCHOLINE CHLORIDE 200 MG/10ML IV SOSY
PREFILLED_SYRINGE | INTRAVENOUS | Status: DC | PRN
  Administered 2016-08-15: 120 mg via INTRAVENOUS

## 2016-08-15 MED ORDER — MIDAZOLAM HCL 2 MG/2ML IJ SOLN
INTRAMUSCULAR | Status: AC
  Filled 2016-08-15: qty 2

## 2016-08-15 MED ORDER — KETOROLAC TROMETHAMINE 30 MG/ML IJ SOLN
30.0000 mg | Freq: Once | INTRAMUSCULAR | Status: AC
Start: 2016-08-15 — End: 2016-08-15
  Administered 2016-08-15: 30 mg via INTRAVENOUS

## 2016-08-15 NOTE — Anesthesia Postprocedure Evaluation (Signed)
Anesthesia Post Note  Patient: Matthew Romero  Procedure(s) Performed: * No procedures listed *  Patient location during evaluation: PACU Anesthesia Type: General Level of consciousness: awake and alert Pain management: pain level controlled Vital Signs Assessment: post-procedure vital signs reviewed and stable Respiratory status: spontaneous breathing, nonlabored ventilation, respiratory function stable and patient connected to nasal cannula oxygen Cardiovascular status: blood pressure returned to baseline and stable Postop Assessment: no signs of nausea or vomiting Anesthetic complications: no     Last Vitals:  Vitals:   08/15/16 1128 08/15/16 1136  BP: 120/79 131/90  Pulse: (!) 103 (!) 107  Resp: 16 16  Temp:      Last Pain:  Vitals:   08/15/16 1059  TempSrc: Temporal  PainSc: Asleep                 Sonita Michiels S

## 2016-08-15 NOTE — Discharge Instructions (Signed)
1)  The drugs that you have been given will stay in your system until tomorrow so for the       next 24 hours you should not:  A. Drive an automobile  B. Make any legal decisions  C. Drink any alcoholic beverages  2)  You may resume your regular meals upon return home.  3)  A responsible adult must take you home.  Someone should stay with you for a few          hours, then be available by phone for the remainder of the treatment day.  4)  You May experience any of the following symptoms:  Headache, Nausea and a dry mouth (due to the medications you were given),  temporary memory loss and some confusion, or sore muscles (a warm bath  should help this).  If you you experience any of these symptoms let us know on                your return visit.  5)  Report any of the following: any acute discomfort, severe headache, or temperature        greater than 100.5 F.   Also report any unusual redness, swelling, drainage, or pain         at your IV site.    You may report Symptoms to:  [place] [facility]          Phone: [phone], ECT Department           or Dr. Prescott Gum office [phone]  6)  Your next ECT Treatment is Day Monday  Date August 18, 2016 at 815am  We will call 2 days prior to your scheduled appointment for arrival times.  7)  Nothing to eat or drink after midnight the night before your procedure.  8)  Take .     With a sip of water the morning of your procedure.  9)  Other Instructions: Call [phone] to cancel the morning of your procedure due         to illness or emergency.  10) We will call within 72 hours to assess how you are feeling.

## 2016-08-15 NOTE — H&P (Addendum)
Matthew Romero is an 26 y.o. male.   Chief Complaint: Patient is still feeling about the same. Very subjective minimal improvement. No side effects HPI: History of severe depression not responding to medication  Past Medical History:  Diagnosis Date  . ADHD (attention deficit hyperactivity disorder)   . Asthma   . Depression     Past Surgical History:  Procedure Laterality Date  . NO PAST SURGERIES      Family History  Problem Relation Age of Onset  . ADD / ADHD Mother   . Anxiety disorder Mother   . Depression Father   . ADD / ADHD Brother   . Depression Brother   . ADD / ADHD Brother   . Alcohol abuse Cousin   . Drug abuse Cousin    Social History:  reports that he has been smoking Cigars.  He has never used smokeless tobacco. He reports that he drinks about 1.8 oz of alcohol per week . He reports that he uses drugs, including Marijuana.  Allergies: No Known Allergies   (Not in a hospital admission)  No results found for this or any previous visit (from the past 48 hour(s)). No results found.  Review of Systems  Constitutional: Negative.   HENT: Negative.   Eyes: Negative.   Respiratory: Negative.   Cardiovascular: Negative.   Gastrointestinal: Negative.   Musculoskeletal: Negative.   Skin: Negative.   Neurological: Negative.   Psychiatric/Behavioral: Positive for depression. Negative for hallucinations, memory loss, substance abuse and suicidal ideas. The patient is not nervous/anxious and does not have insomnia.     Blood pressure 125/81, pulse 60, temperature 98.1 F (36.7 C), temperature source Oral, resp. rate 17, height 6\' 3"  (1.905 m), weight 95.7 kg (211 lb), SpO2 99 %. Physical Exam  Nursing note and vitals reviewed. Constitutional: He appears well-developed and well-nourished.  HENT:  Head: Normocephalic and atraumatic.  Eyes: Conjunctivae are normal. Pupils are equal, round, and reactive to light.  Neck: Normal range of motion.   Cardiovascular: Regular rhythm and normal heart sounds.   Respiratory: Effort normal. No respiratory distress.  GI: Soft.  Musculoskeletal: Normal range of motion.  Neurological: He is alert.  Skin: Skin is warm and dry.  Psychiatric: Judgment and thought content normal. His affect is blunt. His speech is delayed. He is slowed. Cognition and memory are normal.     Assessment/Plan Today's treatment #6. If we don't see any improvement after the weekend I would recommend we consider switch to bilateral treatment  Alethia Berthold, MD 08/15/2016, 10:40 AM

## 2016-08-15 NOTE — Transfer of Care (Signed)
Immediate Anesthesia Transfer of Care Note  Patient: Matthew Romero  Procedure(s) Performed: ECT  Patient Location: PACU  Anesthesia Type:General  Level of Consciousness: sedated  Airway & Oxygen Therapy: Patient Spontanous Breathing and Patient connected to face mask oxygen  Post-op Assessment: Report given to RN and Post -op Vital signs reviewed and stable  Post vital signs: Reviewed and stable  Last Vitals:  Vitals:   08/15/16 0849  BP: 125/81  Pulse: 60  Resp: 17  Temp: 36.7 C    Last Pain:  Vitals:   08/15/16 0849  TempSrc: Oral         Complications: No apparent anesthesia complications

## 2016-08-15 NOTE — Anesthesia Post-op Follow-up Note (Cosign Needed)
Anesthesia QCDR form completed.        

## 2016-08-15 NOTE — Progress Notes (Signed)
Iv fluids for pacu 100

## 2016-08-15 NOTE — Procedures (Signed)
ECT SERVICES Physician's Interval Evaluation & Treatment Note  Patient Identification: Matthew Romero MRN:  [id number] Date of Evaluation:  08/15/2016 TX #: 6  MADRS:   MMSE:   P.E. Findings:  No change to physical exam. Vitals unremarkable. Heart and lungs normal.  Psychiatric Interval Note:  Still depressed not actively suicidal blunted  Subjective:  Patient is a 26 y.o. male seen for evaluation for Electroconvulsive Therapy. No specific change  Treatment Summary:   [x]   Right Unilateral             []  Bilateral   % Energy : 0.3 ms 30%   Impedance: 1560 ohms  Seizure Energy Index: 4235 V squared  Postictal Suppression Index: Less than 10%  Seizure Concordance Index: 87%  Medications  Pre Shock: Toradol 30 mg Brevital 100 mg succinylcholine 120 mg  Post Shock: Versed 2 mg  Seizure Duration: 35 seconds by EMG 76 seconds by EEG although I think it may have gone on significantly longer than that   Comments: We are going to follow-up on Monday although if he is not any better I am going to recommend we switch to bilateral treatment   Lungs:  [x]   Clear to auscultation               []  Other:   Heart:    [x]   Regular rhythm             []  irregular rhythm    [x]   Previous H&P reviewed, patient examined and there are NO CHANGES                 []   Previous H&P reviewed, patient examined and there are changes noted.   Alethia Berthold, MD 2/16/201810:43 AM

## 2016-08-15 NOTE — Anesthesia Preprocedure Evaluation (Signed)
Anesthesia Evaluation  Patient identified by MRN, date of birth, ID band Patient awake    Reviewed: Allergy & Precautions, NPO status , Patient's Chart, lab work & pertinent test results  Airway Mallampati: I       Dental  (+) Teeth Intact   Pulmonary asthma , Current Smoker,    breath sounds clear to auscultation       Cardiovascular Exercise Tolerance: Good  Rhythm:Regular Rate:Normal     Neuro/Psych PSYCHIATRIC DISORDERS Depression    GI/Hepatic negative GI ROS, Neg liver ROS,   Endo/Other  negative endocrine ROS  Renal/GU negative Renal ROS     Musculoskeletal negative musculoskeletal ROS (+)   Abdominal Normal abdominal exam  (+)   Peds  Hematology negative hematology ROS (+)   Anesthesia Other Findings   Reproductive/Obstetrics negative OB ROS                             Anesthesia Physical  Anesthesia Plan  ASA: II  Anesthesia Plan: General   Post-op Pain Management:    Induction: Intravenous  Airway Management Planned: Mask  Additional Equipment:   Intra-op Plan:   Post-operative Plan:   Informed Consent: I have reviewed the patients History and Physical, chart, labs and discussed the procedure including the risks, benefits and alternatives for the proposed anesthesia with the patient or authorized representative who has indicated his/her understanding and acceptance.     Plan Discussed with: CRNA  Anesthesia Plan Comments:         Anesthesia Quick Evaluation

## 2016-08-15 NOTE — Anesthesia Procedure Notes (Signed)
Date/Time: 08/15/2016 10:48 AM Performed by: Dionne Bucy Pre-anesthesia Checklist: Patient identified, Emergency Drugs available, Suction available and Patient being monitored Patient Re-evaluated:Patient Re-evaluated prior to inductionOxygen Delivery Method: Circle system utilized Preoxygenation: Pre-oxygenation with 100% oxygen Intubation Type: IV induction Ventilation: Mask ventilation without difficulty and Mask ventilation throughout procedure Airway Equipment and Method: Bite block Placement Confirmation: positive ETCO2 Dental Injury: Teeth and Oropharynx as per pre-operative assessment

## 2016-08-18 ENCOUNTER — Encounter (HOSPITAL_BASED_OUTPATIENT_CLINIC_OR_DEPARTMENT_OTHER)
Admission: RE | Admit: 2016-08-18 | Discharge: 2016-08-18 | Disposition: A | Discharge status: Home / Self Care | Payer: 59 | Source: Ambulatory Visit | Attending: Psychiatry | Admitting: Psychiatry

## 2016-08-18 ENCOUNTER — Other Ambulatory Visit: Payer: Self-pay | Admitting: Psychiatry

## 2016-08-18 ENCOUNTER — Encounter: Payer: Self-pay | Admitting: Certified Registered"

## 2016-08-18 ENCOUNTER — Other Ambulatory Visit: Payer: Self-pay | Admitting: Family Medicine

## 2016-08-18 DIAGNOSIS — F909 Attention-deficit hyperactivity disorder, unspecified type: Secondary | ICD-10-CM | POA: Diagnosis not present

## 2016-08-18 DIAGNOSIS — F17290 Nicotine dependence, other tobacco product, uncomplicated: Secondary | ICD-10-CM | POA: Diagnosis not present

## 2016-08-18 DIAGNOSIS — Z818 Family history of other mental and behavioral disorders: Secondary | ICD-10-CM | POA: Diagnosis not present

## 2016-08-18 DIAGNOSIS — Z811 Family history of alcohol abuse and dependence: Secondary | ICD-10-CM | POA: Diagnosis not present

## 2016-08-18 DIAGNOSIS — F329 Major depressive disorder, single episode, unspecified: Secondary | ICD-10-CM | POA: Diagnosis not present

## 2016-08-18 DIAGNOSIS — F332 Major depressive disorder, recurrent severe without psychotic features: Secondary | ICD-10-CM

## 2016-08-18 DIAGNOSIS — J45909 Unspecified asthma, uncomplicated: Secondary | ICD-10-CM | POA: Diagnosis not present

## 2016-08-18 DIAGNOSIS — F322 Major depressive disorder, single episode, severe without psychotic features: Secondary | ICD-10-CM

## 2016-08-18 MED ORDER — FENTANYL CITRATE (PF) 100 MCG/2ML IJ SOLN: 25.0000 ug | INTRAMUSCULAR | Status: DC | PRN

## 2016-08-18 MED ORDER — MIDAZOLAM HCL 2 MG/2ML IJ SOLN
INTRAMUSCULAR | Status: AC
  Filled 2016-08-18: qty 2

## 2016-08-18 MED ORDER — SUCCINYLCHOLINE CHLORIDE 20 MG/ML IJ SOLN
INTRAMUSCULAR | Status: DC | PRN
  Administered 2016-08-18: 120 mg via INTRAVENOUS

## 2016-08-18 MED ORDER — METHOHEXITAL SODIUM 0.5 G IJ SOLR
INTRAMUSCULAR | Status: AC
  Filled 2016-08-18: qty 500

## 2016-08-18 MED ORDER — KETOROLAC TROMETHAMINE 30 MG/ML IJ SOLN
30.0000 mg | Freq: Once | INTRAMUSCULAR | Status: AC
  Administered 2016-08-18: 30 mg via INTRAVENOUS

## 2016-08-18 MED ORDER — SODIUM CHLORIDE 0.9 % IV SOLN
500.0000 mL | Freq: Once | INTRAVENOUS | Status: AC
  Administered 2016-08-18: 1000 mL via INTRAVENOUS

## 2016-08-18 MED ORDER — ONDANSETRON HCL 4 MG/2ML IJ SOLN: 4.0000 mg | Freq: Once | INTRAMUSCULAR | Status: DC | PRN

## 2016-08-18 MED ORDER — METHOHEXITAL SODIUM 100 MG/10ML IV SOSY
PREFILLED_SYRINGE | INTRAVENOUS | Status: DC | PRN
  Administered 2016-08-18: 100 mg via INTRAVENOUS

## 2016-08-18 MED ORDER — KETOROLAC TROMETHAMINE 30 MG/ML IJ SOLN
INTRAMUSCULAR | Status: AC
  Administered 2016-08-18: 30 mg via INTRAVENOUS
  Filled 2016-08-18: qty 1

## 2016-08-18 MED ORDER — MIDAZOLAM HCL 2 MG/2ML IJ SOLN: 2.0000 mg | Freq: Once | INTRAMUSCULAR | Status: DC

## 2016-08-18 MED ORDER — SODIUM CHLORIDE 0.9 % IV SOLN
INTRAVENOUS | Status: DC | PRN
  Administered 2016-08-18: 09:00:00 via INTRAVENOUS

## 2016-08-18 MED ORDER — SUCCINYLCHOLINE CHLORIDE 20 MG/ML IJ SOLN
INTRAMUSCULAR | Status: AC
  Filled 2016-08-18: qty 1

## 2016-08-18 NOTE — Anesthesia Postprocedure Evaluation (Signed)
Anesthesia Post Note  Patient: Matthew Romero  Procedure(s) Performed: * No procedures listed *  Patient location during evaluation: PACU Anesthesia Type: General Level of consciousness: awake and alert and oriented Pain management: pain level controlled Vital Signs Assessment: post-procedure vital signs reviewed and stable Respiratory status: spontaneous breathing Cardiovascular status: blood pressure returned to baseline Anesthetic complications: no     Last Vitals:  Vitals:   08/18/16 1114 08/18/16 1133  BP: (!) 142/95 (!) 146/97  Pulse: 94 80  Resp: 12   Temp: 36.6 C     Last Pain:  Vitals:   08/18/16 1114  TempSrc:   PainSc: 0-No pain                 Cassie Shedlock

## 2016-08-18 NOTE — Anesthesia Preprocedure Evaluation (Signed)
Anesthesia Evaluation  Patient identified by MRN, date of birth, ID band Patient awake    Reviewed: Allergy & Precautions, NPO status , Patient's Chart, lab work & pertinent test results  Airway Mallampati: I       Dental  (+) Teeth Intact   Pulmonary asthma , Current Smoker,    breath sounds clear to auscultation       Cardiovascular Exercise Tolerance: Good negative cardio ROS   Rhythm:Regular Rate:Normal     Neuro/Psych PSYCHIATRIC DISORDERS Depression    GI/Hepatic negative GI ROS, Neg liver ROS,   Endo/Other  negative endocrine ROS  Renal/GU negative Renal ROS  negative genitourinary   Musculoskeletal negative musculoskeletal ROS (+)   Abdominal Normal abdominal exam  (+)   Peds negative pediatric ROS (+)  Hematology negative hematology ROS (+)   Anesthesia Other Findings   Reproductive/Obstetrics negative OB ROS                             Anesthesia Physical  Anesthesia Plan  ASA: II  Anesthesia Plan: General   Post-op Pain Management:    Induction: Intravenous  Airway Management Planned: Mask  Additional Equipment:   Intra-op Plan:   Post-operative Plan:   Informed Consent: I have reviewed the patients History and Physical, chart, labs and discussed the procedure including the risks, benefits and alternatives for the proposed anesthesia with the patient or authorized representative who has indicated his/her understanding and acceptance.     Plan Discussed with: CRNA  Anesthesia Plan Comments:         Anesthesia Quick Evaluation

## 2016-08-18 NOTE — Anesthesia Procedure Notes (Signed)
Performed by: Chioke Noxon Pre-anesthesia Checklist: Patient identified, Emergency Drugs available, Suction available and Patient being monitored Patient Re-evaluated:Patient Re-evaluated prior to inductionOxygen Delivery Method: Circle system utilized Preoxygenation: Pre-oxygenation with 100% oxygen Intubation Type: IV induction Ventilation: Mask ventilation without difficulty and Mask ventilation throughout procedure Airway Equipment and Method: Bite block Placement Confirmation: positive ETCO2 Dental Injury: Teeth and Oropharynx as per pre-operative assessment        

## 2016-08-18 NOTE — Anesthesia Post-op Follow-up Note (Cosign Needed)
Anesthesia QCDR form completed.        

## 2016-08-18 NOTE — H&P (Signed)
Matthew Romero is an 26 y.o. male.   Chief Complaint: Continues to have low mood lack of motivation and no real difference. HPI: Major depression severe recurrent with no psychotic features so far minimal to no response to ECT  Past Medical History:  Diagnosis Date  . ADHD (attention deficit hyperactivity disorder)   . Asthma   . Depression     Past Surgical History:  Procedure Laterality Date  . NO PAST SURGERIES      Family History  Problem Relation Age of Onset  . ADD / ADHD Mother   . Anxiety disorder Mother   . Depression Father   . ADD / ADHD Brother   . Depression Brother   . ADD / ADHD Brother   . Alcohol abuse Cousin   . Drug abuse Cousin    Social History:  reports that he has been smoking Cigars.  He has never used smokeless tobacco. He reports that he drinks about 1.8 oz of alcohol per week . He reports that he uses drugs, including Marijuana.  Allergies: No Known Allergies   (Not in a hospital admission)  No results found for this or any previous visit (from the past 48 hour(s)). No results found.  Review of Systems  Constitutional: Negative.   HENT: Negative.   Eyes: Negative.   Respiratory: Negative.   Cardiovascular: Negative.   Gastrointestinal: Negative.   Musculoskeletal: Negative.   Skin: Negative.   Neurological: Negative.   Psychiatric/Behavioral: Positive for depression. Negative for hallucinations, memory loss, substance abuse and suicidal ideas. The patient is not nervous/anxious and does not have insomnia.     Blood pressure 113/68, pulse 70, temperature 98.1 F (36.7 C), temperature source Oral, resp. rate 18, height 6\' 3"  (1.905 m), weight 96.6 kg (213 lb), SpO2 97 %. Physical Exam  Nursing note and vitals reviewed. Constitutional: He appears well-developed and well-nourished.  HENT:  Head: Normocephalic and atraumatic.  Eyes: Conjunctivae are normal. Pupils are equal, round, and reactive to light.  Neck: Normal range of  motion.  Cardiovascular: Regular rhythm and normal heart sounds.   Respiratory: Effort normal. No respiratory distress.  GI: Soft.  Musculoskeletal: Normal range of motion.  Neurological: He is alert.  Skin: Skin is warm and dry.  Psychiatric: Judgment and thought content normal. His affect is blunt. His speech is delayed. He is slowed. Cognition and memory are normal. He exhibits a depressed mood.     Assessment/Plan We are switching to bilateral treatment starting today after reviewing risks and benefits.  Alethia Berthold, MD 08/18/2016, 10:35 AM

## 2016-08-18 NOTE — Transfer of Care (Signed)
Immediate Anesthesia Transfer of Care Note  Patient: Matthew Romero  Procedure(s) Performed: * No procedures listed *  Patient Location: PACU  Anesthesia Type:General  Level of Consciousness: sedated and responds to stimulation  Airway & Oxygen Therapy: Patient Spontanous Breathing and Patient connected to face mask oxygen  Post-op Assessment: Report given to RN and Post -op Vital signs reviewed and stable  Post vital signs: Reviewed and stable  Last Vitals:  Vitals:   08/18/16 0838 08/18/16 1054  BP: 113/68 (!) 141/88  Pulse: 70 (!) 102  Resp: 18 (!) 27  Temp: 36.7 C     Last Pain:  Vitals:   08/18/16 0838  TempSrc: Oral         Complications: No apparent anesthesia complications

## 2016-08-18 NOTE — Procedures (Signed)
ECT SERVICES Physician's Interval Evaluation & Treatment Note  Patient Identification: Matthew Romero MRN:  [id number] Date of Evaluation:  08/18/2016 TX #: 7  MADRS: 24  MMSE: 29  P.E. Findings:  No change to physical exam  Psychiatric Interval Note:  Mood continues to be flat and blunted withdrawn  Subjective:  Patient is a 26 y.o. male seen for evaluation for Electroconvulsive Therapy. Depression  Treatment Summary:   []   Right Unilateral             [x]  Bilateral   % Energy : 1.0 ms 30%   Impedance: 1530 ohms  Seizure Energy Index: 4134 V squared  Postictal Suppression Index: Unclear read is 31%  Seizure Concordance Index: 93%  Medications  Pre Shock: Toradol 30 mg Brevital 100 mg succinylcholine 120 mg  Post Shock: Versed 2 mg  Seizure Duration: 46 seconds by EMG. EEG time unclear possibly 94 seconds   Comments: Follow-up Wednesday   Lungs:  [x]   Clear to auscultation               []  Other:   Heart:    [x]   Regular rhythm             []  irregular rhythm    [x]   Previous H&P reviewed, patient examined and there are NO CHANGES                 []   Previous H&P reviewed, patient examined and there are changes noted.   Alethia Berthold, MD 2/19/201810:37 AM

## 2016-08-18 NOTE — Discharge Instructions (Signed)
1)  The drugs that you have been given will stay in your system until tomorrow so for the       next 24 hours you should not:  A. Drive an automobile  B. Make any legal decisions  C. Drink any alcoholic beverages  2)  You may resume your regular meals upon return home.  3)  A responsible adult must take you home.  Someone should stay with you for a few          hours, then be available by phone for the remainder of the treatment day.  4)  You May experience any of the following symptoms:  Headache, Nausea and a dry mouth (due to the medications you were given),  temporary memory loss and some confusion, or sore muscles (a warm bath  should help this).  If you you experience any of these symptoms let us know on                your return visit.  5)  Report any of the following: any acute discomfort, severe headache, or temperature        greater than 100.5 F.   Also report any unusual redness, swelling, drainage, or pain         at your IV site.    You may report Symptoms to:  [place] at [facility]          Phone: [phone], ECT Department           or Dr. Prescott Gum office [phone]  6)  Your next ECT Treatment is Day Wednesday  Date August 20, 2016  We will call 2 days prior to your scheduled appointment for arrival times.  7)  Nothing to eat or drink after midnight the night before your procedure.  8)  Take .   With a sip of water the morning of your procedure.  9)  Other Instructions: Call [phone] to cancel the morning of your procedure due         to illness or emergency.  10) We will call within 72 hours to assess how you are feeling.

## 2016-08-19 DIAGNOSIS — F322 Major depressive disorder, single episode, severe without psychotic features: Secondary | ICD-10-CM | POA: Diagnosis not present

## 2016-08-20 ENCOUNTER — Encounter: Payer: Self-pay | Admitting: Anesthesiology

## 2016-08-20 ENCOUNTER — Other Ambulatory Visit: Payer: Self-pay | Admitting: Psychiatry

## 2016-08-20 ENCOUNTER — Ambulatory Visit
Admission: RE | Admit: 2016-08-20 | Discharge: 2016-08-20 | Disposition: A | Discharge status: Home / Self Care | Payer: 59 | Source: Ambulatory Visit | Attending: Psychiatry | Admitting: Psychiatry

## 2016-08-20 DIAGNOSIS — F1290 Cannabis use, unspecified, uncomplicated: Secondary | ICD-10-CM | POA: Insufficient documentation

## 2016-08-20 DIAGNOSIS — F17290 Nicotine dependence, other tobacco product, uncomplicated: Secondary | ICD-10-CM | POA: Diagnosis not present

## 2016-08-20 DIAGNOSIS — Z818 Family history of other mental and behavioral disorders: Secondary | ICD-10-CM | POA: Diagnosis not present

## 2016-08-20 DIAGNOSIS — F339 Major depressive disorder, recurrent, unspecified: Secondary | ICD-10-CM | POA: Diagnosis not present

## 2016-08-20 DIAGNOSIS — F332 Major depressive disorder, recurrent severe without psychotic features: Secondary | ICD-10-CM

## 2016-08-20 DIAGNOSIS — F909 Attention-deficit hyperactivity disorder, unspecified type: Secondary | ICD-10-CM | POA: Diagnosis not present

## 2016-08-20 DIAGNOSIS — F338 Other recurrent depressive disorders: Secondary | ICD-10-CM | POA: Diagnosis not present

## 2016-08-20 DIAGNOSIS — J45909 Unspecified asthma, uncomplicated: Secondary | ICD-10-CM | POA: Insufficient documentation

## 2016-08-20 DIAGNOSIS — Z8489 Family history of other specified conditions: Secondary | ICD-10-CM | POA: Diagnosis not present

## 2016-08-20 LAB — TSH: TSH: 1.12 u[IU]/mL (ref 0.450–4.500)

## 2016-08-20 MED ORDER — SUCCINYLCHOLINE CHLORIDE 20 MG/ML IJ SOLN
INTRAMUSCULAR | Status: AC
  Filled 2016-08-20: qty 1

## 2016-08-20 MED ORDER — LACTATED RINGERS IV SOLN: INTRAVENOUS | Status: DC

## 2016-08-20 MED ORDER — KETOROLAC TROMETHAMINE 30 MG/ML IJ SOLN
30.0000 mg | Freq: Once | INTRAMUSCULAR | Status: AC
  Administered 2016-08-20: 30 mg via INTRAVENOUS

## 2016-08-20 MED ORDER — MIDAZOLAM HCL 2 MG/2ML IJ SOLN
INTRAMUSCULAR | Status: AC
  Filled 2016-08-20: qty 2

## 2016-08-20 MED ORDER — SODIUM CHLORIDE 0.9 % IV SOLN
INTRAVENOUS | Status: DC | PRN
  Administered 2016-08-20: 11:00:00 via INTRAVENOUS

## 2016-08-20 MED ORDER — SUCCINYLCHOLINE CHLORIDE 200 MG/10ML IV SOSY
PREFILLED_SYRINGE | INTRAVENOUS | Status: DC | PRN
  Administered 2016-08-20: 120 mg via INTRAVENOUS

## 2016-08-20 MED ORDER — KETOROLAC TROMETHAMINE 30 MG/ML IJ SOLN
INTRAMUSCULAR | Status: AC
  Administered 2016-08-20: 30 mg via INTRAVENOUS
  Filled 2016-08-20: qty 1

## 2016-08-20 MED ORDER — METHOHEXITAL SODIUM 100 MG/10ML IV SOSY
PREFILLED_SYRINGE | INTRAVENOUS | Status: DC | PRN
  Administered 2016-08-20: 100 mg via INTRAVENOUS

## 2016-08-20 MED ORDER — MIDAZOLAM HCL 2 MG/2ML IJ SOLN
INTRAMUSCULAR | Status: DC | PRN
  Administered 2016-08-20: 2 mg via INTRAVENOUS

## 2016-08-20 MED ORDER — SODIUM CHLORIDE 0.9 % IV SOLN
500.0000 mL | Freq: Once | INTRAVENOUS | Status: AC
  Administered 2016-08-20: 1000 mL via INTRAVENOUS

## 2016-08-20 NOTE — Discharge Instructions (Signed)
1)  The drugs that you have been given will stay in your system until tomorrow so for the       next 24 hours you should not:  A. Drive an automobile  B. Make any legal decisions  C. Drink any alcoholic beverages  2)  You may resume your regular meals upon return home.  3)  A responsible adult must take you home.  Someone should stay with you for a few          hours, then be available by phone for the remainder of the treatment day.  4)  You May experience any of the following symptoms:  Headache, Nausea and a dry mouth (due to the medications you were given),  temporary memory loss and some confusion, or sore muscles (a warm bath  should help this).  If you you experience any of these symptoms let us know on                your return visit.  5)  Report any of the following: any acute discomfort, severe headache, or temperature        greater than 100.5 F.   Also report any unusual redness, swelling, drainage, or pain         at your IV site.    You may report Symptoms to:  [place] at [facility]          Phone: [phone], ECT Department           or Dr. Prescott Gum office [phone]  6)  Your next ECT Treatment is Friday February 23 at 8:15   We will call 2 days prior to your scheduled appointment for arrival times.  7)  Nothing to eat or drink after midnight the night before your procedure.  8)  Take     With a sip of water the morning of your procedure.  9)  Other Instructions: Call [phone] to cancel the morning of your procedure due         to illness or emergency.  10) We will call within 72 hours to assess how you are feeling.

## 2016-08-20 NOTE — Anesthesia Post-op Follow-up Note (Cosign Needed)
Anesthesia QCDR form completed.        

## 2016-08-20 NOTE — H&P (Signed)
Matthew Romero is an 26 y.o. male.   Chief Complaint: Continues to be depressed. No change. HPI: Major depression severe recurrent unresponsive to medicine. Now getting towards the end of his index course  Past Medical History:  Diagnosis Date  . ADHD (attention deficit hyperactivity disorder)   . Asthma   . Depression     Past Surgical History:  Procedure Laterality Date  . NO PAST SURGERIES      Family History  Problem Relation Age of Onset  . ADD / ADHD Matthew Romero   . Anxiety disorder Matthew Romero   . Depression Matthew Romero   . ADD / ADHD Matthew Romero   . Depression Matthew Romero   . ADD / ADHD Matthew Romero   . Alcohol abuse Matthew Romero   . Drug abuse Matthew Romero    Social History:  reports that he has been smoking Cigars.  He has never used smokeless tobacco. He reports that he drinks about 1.8 oz of alcohol per week . He reports that he uses drugs, including Marijuana.  Allergies: No Known Allergies   (Not in a hospital admission)  Results for orders placed or performed in visit on 08/18/16 (from the past 48 hour(s))  TSH     Status: None   Collection Time: 08/19/16  2:35 PM  Result Value Ref Range   TSH 1.120 0.450 - 4.500 uIU/mL   No results found.  Review of Systems  Constitutional: Negative.   HENT: Negative.   Eyes: Negative.   Respiratory: Negative.   Cardiovascular: Negative.   Gastrointestinal: Negative.   Musculoskeletal: Negative.   Skin: Negative.   Neurological: Negative.   Psychiatric/Behavioral: Positive for depression. Negative for hallucinations, memory loss, substance abuse and suicidal ideas. The patient is not nervous/anxious and does not have insomnia.     Blood pressure 125/68, pulse (!) 57, temperature 97.9 F (36.6 C), temperature source Oral, resp. rate 17, height 6\' 3"  (1.905 m), weight 98 kg (216 lb), SpO2 97 %. Physical Exam  Nursing note and vitals reviewed. Constitutional: He appears well-developed and well-nourished.  HENT:  Head: Normocephalic and  atraumatic.  Eyes: Conjunctivae are normal. Pupils are equal, round, and reactive to light.  Neck: Normal range of motion.  Cardiovascular: Regular rhythm and normal heart sounds.   Respiratory: Effort normal. No respiratory distress.  GI: Soft.  Musculoskeletal: Normal range of motion.  Neurological: He is alert.  Skin: Skin is warm and dry.  Psychiatric: Judgment normal. His affect is blunt. His speech is delayed. He is slowed. Cognition and memory are normal. He exhibits a depressed mood.     Assessment/Plan Today's treatment #8 and the second treatment as bilateral. If we are seeing no treatment Friday we are likely to consider a termination of the index course.  Matthew Berthold, MD 08/20/2016, 11:04 AM

## 2016-08-20 NOTE — Anesthesia Procedure Notes (Signed)
Date/Time: 08/20/2016 11:12 AM Performed by: Dionne Bucy Pre-anesthesia Checklist: Patient identified, Emergency Drugs available, Suction available and Patient being monitored Patient Re-evaluated:Patient Re-evaluated prior to inductionOxygen Delivery Method: Circle system utilized Preoxygenation: Pre-oxygenation with 100% oxygen Intubation Type: IV induction Ventilation: Mask ventilation without difficulty and Mask ventilation throughout procedure Airway Equipment and Method: Bite block Placement Confirmation: positive ETCO2 Dental Injury: Teeth and Oropharynx as per pre-operative assessment

## 2016-08-20 NOTE — Anesthesia Preprocedure Evaluation (Signed)
Anesthesia Evaluation  Patient identified by MRN, date of birth, ID band Patient awake    Reviewed: Allergy & Precautions, H&P , NPO status , Patient's Chart, lab work & pertinent test results, reviewed documented beta blocker date and time   History of Anesthesia Complications (+) Family history of anesthesia reaction and history of anesthetic complications  Airway Mallampati: II  TM Distance: >3 FB Neck ROM: full    Dental  (+) Chipped, Dental Advidsory Given   Pulmonary neg shortness of breath, asthma , neg sleep apnea, neg COPD, neg recent URI, Current Smoker,           Cardiovascular Exercise Tolerance: Good negative cardio ROS       Neuro/Psych PSYCHIATRIC DISORDERS (Depression and ADHD) negative neurological ROS     GI/Hepatic negative GI ROS, Neg liver ROS,   Endo/Other  negative endocrine ROS  Renal/GU negative Renal ROS  negative genitourinary   Musculoskeletal   Abdominal   Peds  Hematology negative hematology ROS (+)   Anesthesia Other Findings Past Medical History: No date: ADHD (attention deficit hyperactivity disorder) No date: Asthma No date: Depression   Reproductive/Obstetrics negative OB ROS                             Anesthesia Physical  Anesthesia Plan  ASA: II  Anesthesia Plan: General   Post-op Pain Management:    Induction:   Airway Management Planned:   Additional Equipment:   Intra-op Plan:   Post-operative Plan:   Informed Consent: I have reviewed the patients History and Physical, chart, labs and discussed the procedure including the risks, benefits and alternatives for the proposed anesthesia with the patient or authorized representative who has indicated his/her understanding and acceptance.   Dental Advisory Given  Plan Discussed with: Anesthesiologist, CRNA and Surgeon  Anesthesia Plan Comments:         Anesthesia Quick  Evaluation

## 2016-08-20 NOTE — Procedures (Signed)
ECT SERVICES Physician's Interval Evaluation & Treatment Note  Patient Identification: Matthew Romero MRN:  [id number] Date of Evaluation:  08/20/2016 TX #: 8  MADRS:   MMSE:   P.E. Findings:  No change  Psychiatric Interval Note:  Continues with depression  Subjective:  Patient is a 26 y.o. male seen for evaluation for Electroconvulsive Therapy. 1.0 ms 30%  Treatment Summary:   []   Right Unilateral             [x]  Bilateral   % Energy : 1.0 ms 30%   Impedance: 2180 ohms  Seizure Energy Index: 2239 V squared  Postictal Suppression Index: Less than 10%  Seizure Concordance Index: 90%  Medications  Pre Shock: Toradol 30 mg Brevital 100 mg succinylcholine 120 mg  Post Shock: Versed 2 mg  Seizure Duration: 38 seconds by EMG 49 seconds by EEG   Comments: So far no improvement and if we don't see much change by Friday we are likely to stop the treatment scheduled   Lungs:  [x]   Clear to auscultation               []  Other:   Heart:    [x]   Regular rhythm             []  irregular rhythm    [x]   Previous H&P reviewed, patient examined and there are NO CHANGES                 []   Previous H&P reviewed, patient examined and there are changes noted.   Alethia Berthold, MD 2/21/201811:06 AM

## 2016-08-20 NOTE — Transfer of Care (Signed)
Immediate Anesthesia Transfer of Care Note  Patient: Matthew Romero  Procedure(s) Performed: ECT  Patient Location: PACU  Anesthesia Type:General  Level of Consciousness: sedated  Airway & Oxygen Therapy: Patient Spontanous Breathing and Patient connected to face mask oxygen  Post-op Assessment: Report given to RN and Post -op Vital signs reviewed and stable  Post vital signs: Reviewed and stable  Last Vitals:  Vitals:   08/20/16 0902 08/20/16 1121  BP: 125/68 137/75  Pulse: (!) 57 100  Resp: 17 18  Temp: 36.6 C 36.4 C    Last Pain:  Vitals:   08/20/16 0902  TempSrc: Oral         Complications: No apparent anesthesia complications

## 2016-08-22 ENCOUNTER — Encounter (HOSPITAL_BASED_OUTPATIENT_CLINIC_OR_DEPARTMENT_OTHER)
Admission: RE | Admit: 2016-08-22 | Discharge: 2016-08-22 | Disposition: A | Discharge status: Home / Self Care | Payer: 59 | Source: Ambulatory Visit | Attending: Psychiatry | Admitting: Psychiatry

## 2016-08-22 ENCOUNTER — Other Ambulatory Visit: Payer: Self-pay | Admitting: Psychiatry

## 2016-08-22 ENCOUNTER — Encounter: Payer: Self-pay | Admitting: Anesthesiology

## 2016-08-22 DIAGNOSIS — F332 Major depressive disorder, recurrent severe without psychotic features: Secondary | ICD-10-CM

## 2016-08-22 DIAGNOSIS — F909 Attention-deficit hyperactivity disorder, unspecified type: Secondary | ICD-10-CM | POA: Diagnosis not present

## 2016-08-22 DIAGNOSIS — F17290 Nicotine dependence, other tobacco product, uncomplicated: Secondary | ICD-10-CM | POA: Diagnosis not present

## 2016-08-22 DIAGNOSIS — Z818 Family history of other mental and behavioral disorders: Secondary | ICD-10-CM | POA: Diagnosis not present

## 2016-08-22 DIAGNOSIS — Z811 Family history of alcohol abuse and dependence: Secondary | ICD-10-CM | POA: Diagnosis not present

## 2016-08-22 DIAGNOSIS — J45909 Unspecified asthma, uncomplicated: Secondary | ICD-10-CM | POA: Diagnosis not present

## 2016-08-22 DIAGNOSIS — F329 Major depressive disorder, single episode, unspecified: Secondary | ICD-10-CM | POA: Diagnosis not present

## 2016-08-22 MED ORDER — KETOROLAC TROMETHAMINE 30 MG/ML IJ SOLN
30.0000 mg | Freq: Once | INTRAMUSCULAR | Status: AC
  Administered 2016-08-22: 30 mg via INTRAVENOUS

## 2016-08-22 MED ORDER — SODIUM CHLORIDE 0.9 % IV SOLN
INTRAVENOUS | Status: DC | PRN
  Administered 2016-08-22: 12:00:00 via INTRAVENOUS

## 2016-08-22 MED ORDER — SODIUM CHLORIDE 0.9 % IV SOLN
500.0000 mL | Freq: Once | INTRAVENOUS | Status: AC
  Administered 2016-08-22: 10:00:00 via INTRAVENOUS

## 2016-08-22 MED ORDER — METHOHEXITAL SODIUM 100 MG/10ML IV SOSY
PREFILLED_SYRINGE | INTRAVENOUS | Status: DC | PRN
  Administered 2016-08-22: 100 mg via INTRAVENOUS

## 2016-08-22 MED ORDER — MIDAZOLAM HCL 2 MG/2ML IJ SOLN
INTRAMUSCULAR | Status: AC
  Filled 2016-08-22: qty 2

## 2016-08-22 MED ORDER — SUCCINYLCHOLINE CHLORIDE 20 MG/ML IJ SOLN
INTRAMUSCULAR | Status: AC
  Filled 2016-08-22: qty 1

## 2016-08-22 MED ORDER — MIDAZOLAM HCL 2 MG/2ML IJ SOLN
INTRAMUSCULAR | Status: DC | PRN
  Administered 2016-08-22: 2 mg via INTRAVENOUS

## 2016-08-22 MED ORDER — KETOROLAC TROMETHAMINE 30 MG/ML IJ SOLN
INTRAMUSCULAR | Status: AC
  Administered 2016-08-22: 30 mg via INTRAVENOUS
  Filled 2016-08-22: qty 1

## 2016-08-22 MED ORDER — SUCCINYLCHOLINE CHLORIDE 200 MG/10ML IV SOSY
PREFILLED_SYRINGE | INTRAVENOUS | Status: DC | PRN
  Administered 2016-08-22: 120 mg via INTRAVENOUS

## 2016-08-22 NOTE — Anesthesia Post-op Follow-up Note (Cosign Needed)
Anesthesia QCDR form completed.        

## 2016-08-22 NOTE — Discharge Instructions (Signed)
1)  The drugs that you have been given will stay in your system until tomorrow so for the       next 24 hours you should not:  A. Drive an automobile  B. Make any legal decisions  C. Drink any alcoholic beverages  2)  You may resume your regular meals upon return home.  3)  A responsible adult must take you home.  Someone should stay with you for a few          hours, then be available by phone for the remainder of the treatment day.  4)  You May experience any of the following symptoms:  Headache, Nausea and a dry mouth (due to the medications you were given),  temporary memory loss and some confusion, or sore muscles (a warm bath  should help this).  If you you experience any of these symptoms let us know on                your return visit.  5)  Report any of the following: any acute discomfort, severe headache, or temperature        greater than 100.5 F.   Also report any unusual redness, swelling, drainage, or pain         at your IV site.    You may report Symptoms to:  [place] at [facility]          Phone: [phone], ECT Department           or Dr. Prescott Gum office [phone]  6)  Your next ECT Treatment is Day .  Date .  We will call 2 days prior to your scheduled appointment for arrival times.  7)  Nothing to eat or drink after midnight the night before your procedure.  8)  Take .    With a sip of water the morning of your procedure.  9)  Other Instructions: Call [phone] to cancel the morning of your procedure due         to illness or emergency.  10) We will call within 72 hours to assess how you are feeling.

## 2016-08-22 NOTE — Anesthesia Postprocedure Evaluation (Signed)
Anesthesia Post Note  Patient: Matthew Romero  Procedure(s) Performed: * No procedures listed *  Patient location during evaluation: PACU Anesthesia Type: General Level of consciousness: awake and alert Pain management: pain level controlled Vital Signs Assessment: post-procedure vital signs reviewed and stable Respiratory status: spontaneous breathing, nonlabored ventilation, respiratory function stable and patient connected to nasal cannula oxygen Cardiovascular status: blood pressure returned to baseline and stable Postop Assessment: no signs of nausea or vomiting Anesthetic complications: no     Last Vitals:  Vitals:   08/22/16 1218 08/22/16 1230  BP: 128/72 119/75  Pulse: 78 68  Resp: 15 18  Temp:      Last Pain:  Vitals:   08/22/16 1218  TempSrc:   PainSc: 0-No pain                 Precious Haws Piscitello

## 2016-08-22 NOTE — Procedures (Signed)
ECT SERVICES Physician's Interval Evaluation & Treatment Note  Patient Identification: Matthew Romero MRN:  [id number] Date of Evaluation:  08/22/2016 TX #: 9  MADRS:   MMSE:   P.E. Findings:  No change to baseline  Psychiatric Interval Note:  Depression without active si  Subjective:  Patient is a 26 y.o. male seen for evaluation for Electroconvulsive Therapy. depression  Treatment Summary:   []   Right Unilateral             [x]  Bilateral   % Energy : 1.75ms 30%   Impedance: 1410 ohms  Seizure Energy Index: no reading  Postictal Suppression Index: no reading  Seizure Concordance Index: no reading  Medications  Pre Shock: tor 30mg , brev 100mg , suc 120mg   Post Shock: ver 2mg   Seizure Duration: 41 emg, 47 eeg   Comments: No further treatment  Lungs:  [x]   Clear to auscultation               []  Other:   Heart:    [x]   Regular rhythm             []  irregular rhythm    [x]   Previous H&P reviewed, patient examined and there are NO CHANGES                 []   Previous H&P reviewed, patient examined and there are changes noted.   Alethia Berthold, MD 2/23/201811:35 AM

## 2016-08-22 NOTE — Anesthesia Preprocedure Evaluation (Signed)
Anesthesia Evaluation  Patient identified by MRN, date of birth, ID band Patient awake    Reviewed: Allergy & Precautions, H&P , NPO status , Patient's Chart, lab work & pertinent test results, reviewed documented beta blocker date and time   History of Anesthesia Complications (+) Family history of anesthesia reaction and history of anesthetic complications  Airway Mallampati: II  TM Distance: >3 FB Neck ROM: full    Dental  (+) Chipped, Dental Advidsory Given, Poor Dentition   Pulmonary neg shortness of breath, asthma , neg sleep apnea, neg COPD, neg recent URI, Current Smoker,    Pulmonary exam normal        Cardiovascular Exercise Tolerance: Good negative cardio ROS Normal cardiovascular exam     Neuro/Psych PSYCHIATRIC DISORDERS Depression negative neurological ROS     GI/Hepatic negative GI ROS, Neg liver ROS,   Endo/Other  negative endocrine ROS  Renal/GU negative Renal ROS  negative genitourinary   Musculoskeletal negative musculoskeletal ROS (+)   Abdominal Normal abdominal exam  (+)   Peds negative pediatric ROS (+)  Hematology negative hematology ROS (+)   Anesthesia Other Findings Past Medical History: No date: ADHD (attention deficit hyperactivity disorder) No date: Asthma No date: Depression   Reproductive/Obstetrics negative OB ROS                             Anesthesia Physical  Anesthesia Plan  ASA: III  Anesthesia Plan: General   Post-op Pain Management:    Induction:   Airway Management Planned: Mask  Additional Equipment:   Intra-op Plan:   Post-operative Plan:   Informed Consent: I have reviewed the patients History and Physical, chart, labs and discussed the procedure including the risks, benefits and alternatives for the proposed anesthesia with the patient or authorized representative who has indicated his/her understanding and acceptance.    Dental Advisory Given  Plan Discussed with: Anesthesiologist, CRNA and Surgeon  Anesthesia Plan Comments:         Anesthesia Quick Evaluation

## 2016-08-22 NOTE — Transfer of Care (Signed)
Immediate Anesthesia Transfer of Care Note  Patient: Matthew Romero  Procedure(s) Performed: ECT  Patient Location: PACU  Anesthesia Type:General  Level of Consciousness: sedated  Airway & Oxygen Therapy: Patient Spontanous Breathing and Patient connected to face mask oxygen  Post-op Assessment: Report given to RN and Post -op Vital signs reviewed and stable  Post vital signs: Reviewed and stable  Last Vitals:  Vitals:   08/22/16 0903 08/22/16 1152  BP: 122/78   Pulse: (!) 53   Resp: 18   Temp: 37.2 C (P) 37.2 C    Last Pain:  Vitals:   08/22/16 0903  TempSrc: Oral         Complications: No apparent anesthesia complications

## 2016-08-22 NOTE — Anesthesia Postprocedure Evaluation (Signed)
Anesthesia Post Note  Patient: Matthew Romero  Procedure(s) Performed: * No procedures listed *  Patient location during evaluation: PACU Anesthesia Type: General Level of consciousness: awake and alert Pain management: pain level controlled Vital Signs Assessment: post-procedure vital signs reviewed and stable Respiratory status: spontaneous breathing, nonlabored ventilation, respiratory function stable and patient connected to nasal cannula oxygen Cardiovascular status: blood pressure returned to baseline and stable Postop Assessment: no signs of nausea or vomiting Anesthetic complications: no     Last Vitals:  Vitals:   08/20/16 1151 08/20/16 1159  BP: 121/74 128/75  Pulse: 84   Resp: 18 16  Temp:  36.4 C    Last Pain:  Vitals:   08/20/16 1159  TempSrc: Oral  PainSc:                  Martha Clan

## 2016-08-22 NOTE — H&P (Signed)
Matthew Romero is an 26 y.o. male.   Chief Complaint: continued depression without active suicidal ideation HPI: severe derpession without response  Past Medical History:  Diagnosis Date  . ADHD (attention deficit hyperactivity disorder)   . Asthma   . Depression     Past Surgical History:  Procedure Laterality Date  . NO PAST SURGERIES      Family History  Problem Relation Age of Onset  . ADD / ADHD Mother   . Anxiety disorder Mother   . Depression Father   . ADD / ADHD Brother   . Depression Brother   . ADD / ADHD Brother   . Alcohol abuse Cousin   . Drug abuse Cousin    Social History:  reports that he has been smoking Cigars.  He has never used smokeless tobacco. He reports that he drinks about 1.8 oz of alcohol per week . He reports that he uses drugs, including Marijuana.  Allergies: No Known Allergies   (Not in a hospital admission)  No results found for this or any previous visit (from the past 48 hour(s)). No results found.  Review of Systems  Constitutional: Negative.   HENT: Negative.   Eyes: Negative.   Respiratory: Negative.   Cardiovascular: Negative.   Gastrointestinal: Negative.   Musculoskeletal: Negative.   Skin: Negative.   Neurological: Negative.   Psychiatric/Behavioral: Positive for depression. Negative for hallucinations, memory loss, substance abuse and suicidal ideas. The patient is not nervous/anxious and does not have insomnia.     Blood pressure 122/78, pulse (!) 53, temperature 98.9 F (37.2 C), temperature source Oral, resp. rate 18, height 6\' 3"  (1.905 m), weight 96.6 kg (213 lb), SpO2 99 %. Physical Exam  Nursing note and vitals reviewed. Constitutional: He appears well-developed and well-nourished.  HENT:  Head: Normocephalic and atraumatic.  Eyes: Conjunctivae are normal. Pupils are equal, round, and reactive to light.  Neck: Normal range of motion.  Cardiovascular: Regular rhythm and normal heart sounds.    Respiratory: Effort normal. No respiratory distress.  GI: Soft.  Musculoskeletal: Normal range of motion.  Neurological: He is alert.  Skin: Skin is warm and dry.  Psychiatric: Judgment normal. His affect is blunt. His speech is delayed. He is slowed. Thought content is not paranoid. Cognition and memory are normal. He expresses no homicidal and no suicidal ideation.     Assessment/Plan Follow up outpt tx with meds but dc ect  Alethia Berthold, MD 08/22/2016, 11:32 AM

## 2016-08-27 DIAGNOSIS — F332 Major depressive disorder, recurrent severe without psychotic features: Secondary | ICD-10-CM | POA: Diagnosis not present

## 2016-08-27 DIAGNOSIS — F909 Attention-deficit hyperactivity disorder, unspecified type: Secondary | ICD-10-CM | POA: Diagnosis not present

## 2016-08-28 DIAGNOSIS — F332 Major depressive disorder, recurrent severe without psychotic features: Secondary | ICD-10-CM | POA: Diagnosis not present

## 2016-08-28 DIAGNOSIS — F909 Attention-deficit hyperactivity disorder, unspecified type: Secondary | ICD-10-CM | POA: Diagnosis not present

## 2016-08-29 DIAGNOSIS — Z5181 Encounter for therapeutic drug level monitoring: Secondary | ICD-10-CM | POA: Diagnosis not present

## 2016-08-29 DIAGNOSIS — F332 Major depressive disorder, recurrent severe without psychotic features: Secondary | ICD-10-CM | POA: Diagnosis not present

## 2016-08-29 DIAGNOSIS — F909 Attention-deficit hyperactivity disorder, unspecified type: Secondary | ICD-10-CM | POA: Diagnosis not present

## 2016-08-30 DIAGNOSIS — F909 Attention-deficit hyperactivity disorder, unspecified type: Secondary | ICD-10-CM | POA: Diagnosis not present

## 2016-08-30 DIAGNOSIS — F332 Major depressive disorder, recurrent severe without psychotic features: Secondary | ICD-10-CM | POA: Diagnosis not present

## 2016-08-31 DIAGNOSIS — F332 Major depressive disorder, recurrent severe without psychotic features: Secondary | ICD-10-CM | POA: Diagnosis not present

## 2016-08-31 DIAGNOSIS — F909 Attention-deficit hyperactivity disorder, unspecified type: Secondary | ICD-10-CM | POA: Diagnosis not present

## 2016-09-01 DIAGNOSIS — F909 Attention-deficit hyperactivity disorder, unspecified type: Secondary | ICD-10-CM | POA: Diagnosis not present

## 2016-09-01 DIAGNOSIS — F332 Major depressive disorder, recurrent severe without psychotic features: Secondary | ICD-10-CM | POA: Diagnosis not present

## 2016-09-02 DIAGNOSIS — F909 Attention-deficit hyperactivity disorder, unspecified type: Secondary | ICD-10-CM | POA: Diagnosis not present

## 2016-09-02 DIAGNOSIS — F332 Major depressive disorder, recurrent severe without psychotic features: Secondary | ICD-10-CM | POA: Diagnosis not present

## 2016-09-03 DIAGNOSIS — F909 Attention-deficit hyperactivity disorder, unspecified type: Secondary | ICD-10-CM | POA: Diagnosis not present

## 2016-09-03 DIAGNOSIS — F332 Major depressive disorder, recurrent severe without psychotic features: Secondary | ICD-10-CM | POA: Diagnosis not present

## 2016-09-04 DIAGNOSIS — F909 Attention-deficit hyperactivity disorder, unspecified type: Secondary | ICD-10-CM | POA: Diagnosis not present

## 2016-09-04 DIAGNOSIS — Z79899 Other long term (current) drug therapy: Secondary | ICD-10-CM | POA: Diagnosis not present

## 2016-09-04 DIAGNOSIS — F332 Major depressive disorder, recurrent severe without psychotic features: Secondary | ICD-10-CM | POA: Diagnosis not present

## 2016-09-05 DIAGNOSIS — F332 Major depressive disorder, recurrent severe without psychotic features: Secondary | ICD-10-CM | POA: Diagnosis not present

## 2016-09-05 DIAGNOSIS — F909 Attention-deficit hyperactivity disorder, unspecified type: Secondary | ICD-10-CM | POA: Diagnosis not present

## 2016-09-06 DIAGNOSIS — F909 Attention-deficit hyperactivity disorder, unspecified type: Secondary | ICD-10-CM | POA: Diagnosis not present

## 2016-09-06 DIAGNOSIS — F332 Major depressive disorder, recurrent severe without psychotic features: Secondary | ICD-10-CM | POA: Diagnosis not present

## 2016-09-07 DIAGNOSIS — F332 Major depressive disorder, recurrent severe without psychotic features: Secondary | ICD-10-CM | POA: Diagnosis not present

## 2016-09-07 DIAGNOSIS — F909 Attention-deficit hyperactivity disorder, unspecified type: Secondary | ICD-10-CM | POA: Diagnosis not present

## 2016-09-08 DIAGNOSIS — F909 Attention-deficit hyperactivity disorder, unspecified type: Secondary | ICD-10-CM | POA: Diagnosis not present

## 2016-09-08 DIAGNOSIS — F332 Major depressive disorder, recurrent severe without psychotic features: Secondary | ICD-10-CM | POA: Diagnosis not present

## 2016-09-09 DIAGNOSIS — F332 Major depressive disorder, recurrent severe without psychotic features: Secondary | ICD-10-CM | POA: Diagnosis not present

## 2016-09-09 DIAGNOSIS — F909 Attention-deficit hyperactivity disorder, unspecified type: Secondary | ICD-10-CM | POA: Diagnosis not present

## 2016-09-10 DIAGNOSIS — F332 Major depressive disorder, recurrent severe without psychotic features: Secondary | ICD-10-CM | POA: Diagnosis not present

## 2016-09-10 DIAGNOSIS — F909 Attention-deficit hyperactivity disorder, unspecified type: Secondary | ICD-10-CM | POA: Diagnosis not present

## 2016-09-11 DIAGNOSIS — F332 Major depressive disorder, recurrent severe without psychotic features: Secondary | ICD-10-CM | POA: Diagnosis not present

## 2016-09-11 DIAGNOSIS — F909 Attention-deficit hyperactivity disorder, unspecified type: Secondary | ICD-10-CM | POA: Diagnosis not present

## 2016-09-12 DIAGNOSIS — F909 Attention-deficit hyperactivity disorder, unspecified type: Secondary | ICD-10-CM | POA: Diagnosis not present

## 2016-09-12 DIAGNOSIS — F332 Major depressive disorder, recurrent severe without psychotic features: Secondary | ICD-10-CM | POA: Diagnosis not present

## 2016-09-13 DIAGNOSIS — F909 Attention-deficit hyperactivity disorder, unspecified type: Secondary | ICD-10-CM | POA: Diagnosis not present

## 2016-09-13 DIAGNOSIS — F332 Major depressive disorder, recurrent severe without psychotic features: Secondary | ICD-10-CM | POA: Diagnosis not present

## 2016-09-14 DIAGNOSIS — F332 Major depressive disorder, recurrent severe without psychotic features: Secondary | ICD-10-CM | POA: Diagnosis not present

## 2016-09-14 DIAGNOSIS — F909 Attention-deficit hyperactivity disorder, unspecified type: Secondary | ICD-10-CM | POA: Diagnosis not present

## 2016-09-15 DIAGNOSIS — F332 Major depressive disorder, recurrent severe without psychotic features: Secondary | ICD-10-CM | POA: Diagnosis not present

## 2016-09-15 DIAGNOSIS — F909 Attention-deficit hyperactivity disorder, unspecified type: Secondary | ICD-10-CM | POA: Diagnosis not present

## 2016-09-16 DIAGNOSIS — F332 Major depressive disorder, recurrent severe without psychotic features: Secondary | ICD-10-CM | POA: Diagnosis not present

## 2016-09-16 DIAGNOSIS — F909 Attention-deficit hyperactivity disorder, unspecified type: Secondary | ICD-10-CM | POA: Diagnosis not present

## 2016-09-17 DIAGNOSIS — F332 Major depressive disorder, recurrent severe without psychotic features: Secondary | ICD-10-CM | POA: Diagnosis not present

## 2016-09-17 DIAGNOSIS — F909 Attention-deficit hyperactivity disorder, unspecified type: Secondary | ICD-10-CM | POA: Diagnosis not present

## 2016-09-18 DIAGNOSIS — F332 Major depressive disorder, recurrent severe without psychotic features: Secondary | ICD-10-CM | POA: Diagnosis not present

## 2016-09-18 DIAGNOSIS — F909 Attention-deficit hyperactivity disorder, unspecified type: Secondary | ICD-10-CM | POA: Diagnosis not present

## 2016-09-19 DIAGNOSIS — F332 Major depressive disorder, recurrent severe without psychotic features: Secondary | ICD-10-CM | POA: Diagnosis not present

## 2016-09-19 DIAGNOSIS — F909 Attention-deficit hyperactivity disorder, unspecified type: Secondary | ICD-10-CM | POA: Diagnosis not present

## 2016-09-23 DIAGNOSIS — F5105 Insomnia due to other mental disorder: Secondary | ICD-10-CM | POA: Diagnosis not present

## 2016-09-23 DIAGNOSIS — F900 Attention-deficit hyperactivity disorder, predominantly inattentive type: Secondary | ICD-10-CM | POA: Diagnosis not present

## 2016-09-23 DIAGNOSIS — F341 Dysthymic disorder: Secondary | ICD-10-CM | POA: Diagnosis not present

## 2016-09-23 DIAGNOSIS — F332 Major depressive disorder, recurrent severe without psychotic features: Secondary | ICD-10-CM | POA: Diagnosis not present

## 2016-09-26 DIAGNOSIS — F5105 Insomnia due to other mental disorder: Secondary | ICD-10-CM | POA: Diagnosis not present

## 2016-09-26 DIAGNOSIS — F332 Major depressive disorder, recurrent severe without psychotic features: Secondary | ICD-10-CM | POA: Diagnosis not present

## 2016-09-26 DIAGNOSIS — F900 Attention-deficit hyperactivity disorder, predominantly inattentive type: Secondary | ICD-10-CM | POA: Diagnosis not present

## 2016-10-14 DIAGNOSIS — F900 Attention-deficit hyperactivity disorder, predominantly inattentive type: Secondary | ICD-10-CM | POA: Diagnosis not present

## 2016-10-14 DIAGNOSIS — F5105 Insomnia due to other mental disorder: Secondary | ICD-10-CM | POA: Diagnosis not present

## 2016-10-14 DIAGNOSIS — F332 Major depressive disorder, recurrent severe without psychotic features: Secondary | ICD-10-CM | POA: Diagnosis not present

## 2016-10-15 DIAGNOSIS — F5105 Insomnia due to other mental disorder: Secondary | ICD-10-CM | POA: Diagnosis not present

## 2016-10-15 DIAGNOSIS — H5000 Unspecified esotropia: Secondary | ICD-10-CM | POA: Diagnosis not present

## 2016-10-15 DIAGNOSIS — F332 Major depressive disorder, recurrent severe without psychotic features: Secondary | ICD-10-CM | POA: Diagnosis not present

## 2016-10-15 DIAGNOSIS — F900 Attention-deficit hyperactivity disorder, predominantly inattentive type: Secondary | ICD-10-CM | POA: Diagnosis not present

## 2016-10-17 ENCOUNTER — Ambulatory Visit (INDEPENDENT_AMBULATORY_CARE_PROVIDER_SITE_OTHER): Payer: 59 | Admitting: Family Medicine

## 2016-10-17 VITALS — BP 128/78 | HR 80 | Temp 98.9°F | Resp 16 | Ht 75.0 in | Wt 204.0 lb

## 2016-10-17 DIAGNOSIS — G4710 Hypersomnia, unspecified: Secondary | ICD-10-CM

## 2016-10-17 DIAGNOSIS — G4709 Other insomnia: Secondary | ICD-10-CM

## 2016-10-17 NOTE — Progress Notes (Signed)
Patient: Matthew Romero Male    DOB: 1990/07/13   25 y.o.   MRN: [id number] Visit Date: 10/17/2016  Today's Provider: Lelon Huh, MD   Chief Complaint  Patient presents with  . possible sleep apnea   Subjective:    HPI Patient presents today to discuss possible sleep apnea. Patient reports that he does not have an issue falling asleep, he has an issue staying asleep. He currently takes Trazodone 150mg  at bedtime. He states he has had trouble waking up several times a night and unable to get back t sleep for the last year or two. He has now trouble falling asleep on his current medications prescribed by Dr. Nicolasa Ducking. He was recently discharged for inpatient treatment of depression and states he no longer feels depressed at all, but is still having trouble staying asleep. There is nothing particular waking him up, such as pain, shortness of breath, agitation, or restless legs. He feels poorly rested when he gets up in the morning and feels sleepy all day, although he does not doze off easily.     No Known Allergies   Current Outpatient Prescriptions:  .  albuterol (PROVENTIL HFA;VENTOLIN HFA) 108 (90 Base) MCG/ACT inhaler, Inhale 2 puffs into the lungs every 6 (six) hours as needed for wheezing or shortness of breath., Disp: , Rfl:  .  amphetamine-dextroamphetamine (ADDERALL XR) 30 MG 24 hr capsule, Take 1 capsule (30 mg total) by mouth daily., Disp: 90 capsule, Rfl: 0 .  amphetamine-dextroamphetamine (ADDERALL) 20 MG tablet, Take 1 tablet (20 mg total) by mouth daily. (Patient taking differently: Take 40 mg by mouth daily. ), Disp: 1 tablet, Rfl: 0 .  Brexpiprazole 2 MG TABS, Take by mouth., Disp: , Rfl:  .  FLUoxetine (PROZAC) 20 MG capsule, , Disp: , Rfl: 0 .  traZODone (DESYREL) 50 MG tablet, Take 50 mg by mouth at bedtime. , Disp: , Rfl: 2 .  DULoxetine (CYMBALTA) 60 MG capsule, , Disp: , Rfl: 2  Review of Systems  Constitutional: Positive for fatigue. Negative for  activity change, appetite change, chills, diaphoresis, fever and unexpected weight change.  Respiratory: Positive for shortness of breath.        Comes and goes. Patient has asthma.   Cardiovascular: Negative for chest pain, palpitations and leg swelling.  Neurological: Negative for dizziness, light-headedness and headaches.    Social History  Substance Use Topics  . Smoking status: Light Tobacco Smoker    Types: Cigars  . Smokeless tobacco: Never Used     Comment: cigar 1 once a week or 1 a month  . Alcohol use 1.8 oz/week    1 Glasses of wine, 2 Cans of beer per week   Objective:   BP 128/78 (BP Location: Left Arm, Patient Position: Sitting)   Pulse 80   Temp 98.9 F (37.2 C)   Resp 16   Ht 6\' 3"  (1.905 m)   Wt 204 lb (92.5 kg)   SpO2 96%   BMI 25.50 kg/m  Wt Readings from Last 3 Encounters:  10/17/16 204 lb (92.5 kg)  08/01/16 205 lb (93 kg)  06/28/15 184 lb (83.5 kg)      Physical Exam   General Appearance:    Alert, cooperative, no distress  HENT:   ENT exam normal, no neck nodes or sinus tenderness. Small oral airway  Eyes:    PERRL, conjunctiva/corneas clear, EOM's intact       Lungs:  Clear to auscultation bilaterally, respirations unlabored  Heart:    Regular rate and rhythm  Neurologic:   Awake, alert, oriented x 3. No apparent focal neurological           defect.           Assessment & Plan:     1. Hypersomnia  - Home sleep test; Future  2. Other insomnia        Lelon Huh, MD  [facility]

## 2016-10-23 DIAGNOSIS — F900 Attention-deficit hyperactivity disorder, predominantly inattentive type: Secondary | ICD-10-CM | POA: Diagnosis not present

## 2016-10-23 DIAGNOSIS — F5105 Insomnia due to other mental disorder: Secondary | ICD-10-CM | POA: Diagnosis not present

## 2016-10-23 DIAGNOSIS — F332 Major depressive disorder, recurrent severe without psychotic features: Secondary | ICD-10-CM | POA: Diagnosis not present

## 2016-10-29 DIAGNOSIS — R0602 Shortness of breath: Secondary | ICD-10-CM | POA: Diagnosis not present

## 2016-10-29 DIAGNOSIS — G4733 Obstructive sleep apnea (adult) (pediatric): Secondary | ICD-10-CM | POA: Diagnosis not present

## 2016-10-30 DIAGNOSIS — R0602 Shortness of breath: Secondary | ICD-10-CM | POA: Diagnosis not present

## 2016-10-30 DIAGNOSIS — G4733 Obstructive sleep apnea (adult) (pediatric): Secondary | ICD-10-CM | POA: Diagnosis not present

## 2016-10-31 DIAGNOSIS — G4733 Obstructive sleep apnea (adult) (pediatric): Secondary | ICD-10-CM | POA: Diagnosis not present

## 2016-10-31 DIAGNOSIS — R0602 Shortness of breath: Secondary | ICD-10-CM | POA: Diagnosis not present

## 2016-11-01 DIAGNOSIS — F341 Dysthymic disorder: Secondary | ICD-10-CM | POA: Diagnosis not present

## 2016-11-01 DIAGNOSIS — F332 Major depressive disorder, recurrent severe without psychotic features: Secondary | ICD-10-CM | POA: Diagnosis not present

## 2016-11-01 DIAGNOSIS — F5105 Insomnia due to other mental disorder: Secondary | ICD-10-CM | POA: Diagnosis not present

## 2016-11-05 ENCOUNTER — Encounter: Payer: Self-pay | Admitting: Family Medicine

## 2016-11-05 ENCOUNTER — Telehealth: Payer: Self-pay | Admitting: Family Medicine

## 2016-11-05 DIAGNOSIS — G4733 Obstructive sleep apnea (adult) (pediatric): Secondary | ICD-10-CM | POA: Insufficient documentation

## 2016-11-05 NOTE — Telephone Encounter (Signed)
Please advise that sleep study does show moderate sleep apnea and needs trial of CPAP. Have sent order to [facility] to set up sleep study.

## 2016-11-06 NOTE — Telephone Encounter (Signed)
Patient was notified of results. Expressed understanding.  

## 2016-11-07 ENCOUNTER — Encounter: Payer: Self-pay | Admitting: Family Medicine

## 2016-11-26 DIAGNOSIS — F332 Major depressive disorder, recurrent severe without psychotic features: Secondary | ICD-10-CM | POA: Diagnosis not present

## 2016-11-26 DIAGNOSIS — F900 Attention-deficit hyperactivity disorder, predominantly inattentive type: Secondary | ICD-10-CM | POA: Diagnosis not present

## 2016-11-26 DIAGNOSIS — F5105 Insomnia due to other mental disorder: Secondary | ICD-10-CM | POA: Diagnosis not present

## 2016-12-17 DIAGNOSIS — H5005 Alternating esotropia: Secondary | ICD-10-CM | POA: Diagnosis not present

## 2016-12-23 DIAGNOSIS — Z760 Encounter for issue of repeat prescription: Secondary | ICD-10-CM | POA: Diagnosis not present

## 2017-01-19 DIAGNOSIS — H5005 Alternating esotropia: Secondary | ICD-10-CM | POA: Diagnosis not present

## 2017-01-26 ENCOUNTER — Encounter (HOSPITAL_BASED_OUTPATIENT_CLINIC_OR_DEPARTMENT_OTHER): Payer: Self-pay | Admitting: *Deleted

## 2017-01-26 NOTE — Pre-Procedure Instructions (Signed)
Instructed to stop all supplements until after surgery.

## 2017-01-27 ENCOUNTER — Ambulatory Visit: Payer: Self-pay | Admitting: Ophthalmology

## 2017-01-28 ENCOUNTER — Ambulatory Visit: Payer: Self-pay | Admitting: Ophthalmology

## 2017-01-28 NOTE — H&P (Signed)
Date of examination:  01-19-17  Indication for surgery: to straighten the eyes and allow some binocularity  Pertinent past medical history:  Past Medical History:  Diagnosis Date  . ADD (attention deficit disorder)   . Esotropia of both eyes 12/2016  . Exercise-induced asthma    prn inhaler  . Major depressive disorder   . Sleep apnea    no  CPAP use    Pertinent ocular history:  ET first noticed 6-8 years ago.  MRI negative.  Myopic not overminused  Pertinent family history:  Family History  Problem Relation Age of Onset  . ADD / ADHD Mother   . Anxiety disorder Mother   . Depression Father   . ADD / ADHD Brother   . Depression Brother   . ADD / ADHD Brother   . Alcohol abuse Cousin   . Drug abuse Cousin     General:  Healthy appearing patient in no distress.    Eyes:    Acuity  cc OD 20/20  OS 20/20  External: Within normal limits     Anterior segment: Within normal limits     Motility:   Cc ET=25 comitant, ET' 25  Fundus: Normal     Refraction: -2 OU approx  Heart: Regular rate and rhythm without murmur     Lungs: Clear to auscultation     Impression:Esotropia, acquired, nonaccommodative  Myopia, not overminused  Plan: Medial rectus muscle recession both eyes  Matthew Romero O

## 2017-01-30 ENCOUNTER — Encounter (HOSPITAL_BASED_OUTPATIENT_CLINIC_OR_DEPARTMENT_OTHER)
Admission: RE | Disposition: A | Discharge status: Home / Self Care | Payer: Self-pay | Source: Ambulatory Visit | Attending: Ophthalmology

## 2017-01-30 ENCOUNTER — Ambulatory Visit (HOSPITAL_BASED_OUTPATIENT_CLINIC_OR_DEPARTMENT_OTHER): Payer: 59 | Admitting: Anesthesiology

## 2017-01-30 ENCOUNTER — Encounter (HOSPITAL_BASED_OUTPATIENT_CLINIC_OR_DEPARTMENT_OTHER): Payer: Self-pay

## 2017-01-30 ENCOUNTER — Ambulatory Visit (HOSPITAL_BASED_OUTPATIENT_CLINIC_OR_DEPARTMENT_OTHER)
Admission: RE | Admit: 2017-01-30 | Discharge: 2017-01-30 | Disposition: A | Discharge status: Home / Self Care | Payer: 59 | Source: Ambulatory Visit | Attending: Ophthalmology | Admitting: Ophthalmology

## 2017-01-30 DIAGNOSIS — J45990 Exercise induced bronchospasm: Secondary | ICD-10-CM | POA: Diagnosis not present

## 2017-01-30 DIAGNOSIS — H5000 Unspecified esotropia: Secondary | ICD-10-CM | POA: Insufficient documentation

## 2017-01-30 DIAGNOSIS — G4730 Sleep apnea, unspecified: Secondary | ICD-10-CM | POA: Insufficient documentation

## 2017-01-30 DIAGNOSIS — H5010 Unspecified exotropia: Secondary | ICD-10-CM | POA: Diagnosis not present

## 2017-01-30 DIAGNOSIS — F329 Major depressive disorder, single episode, unspecified: Secondary | ICD-10-CM | POA: Insufficient documentation

## 2017-01-30 DIAGNOSIS — F988 Other specified behavioral and emotional disorders with onset usually occurring in childhood and adolescence: Secondary | ICD-10-CM | POA: Insufficient documentation

## 2017-01-30 DIAGNOSIS — F17200 Nicotine dependence, unspecified, uncomplicated: Secondary | ICD-10-CM | POA: Insufficient documentation

## 2017-01-30 DIAGNOSIS — G4733 Obstructive sleep apnea (adult) (pediatric): Secondary | ICD-10-CM | POA: Diagnosis not present

## 2017-01-30 DIAGNOSIS — H5005 Alternating esotropia: Secondary | ICD-10-CM | POA: Diagnosis not present

## 2017-01-30 DIAGNOSIS — F909 Attention-deficit hyperactivity disorder, unspecified type: Secondary | ICD-10-CM | POA: Diagnosis not present

## 2017-01-30 HISTORY — DX: Major depressive disorder, single episode, unspecified: F32.9

## 2017-01-30 HISTORY — DX: Sleep apnea, unspecified: G47.30

## 2017-01-30 HISTORY — DX: Exercise induced bronchospasm: J45.990

## 2017-01-30 HISTORY — PX: STRABISMUS SURGERY: SHX218

## 2017-01-30 HISTORY — DX: Other specified behavioral and emotional disorders with onset usually occurring in childhood and adolescence: F98.8

## 2017-01-30 SURGERY — STRABISMUS SURGERY, BILATERAL
Anesthesia: General | Site: Eye | Laterality: Bilateral

## 2017-01-30 MED ORDER — MIDAZOLAM HCL 2 MG/2ML IJ SOLN
INTRAMUSCULAR | Status: AC
  Filled 2017-01-30: qty 2

## 2017-01-30 MED ORDER — HYDROMORPHONE HCL 1 MG/ML IJ SOLN
INTRAMUSCULAR | Status: AC
  Filled 2017-01-30: qty 0.5

## 2017-01-30 MED ORDER — FENTANYL CITRATE (PF) 100 MCG/2ML IJ SOLN
50.0000 ug | INTRAMUSCULAR | Status: DC | PRN
  Administered 2017-01-30: 100 ug via INTRAVENOUS

## 2017-01-30 MED ORDER — LACTATED RINGERS IV SOLN
INTRAVENOUS | Status: DC
  Administered 2017-01-30 (×3): via INTRAVENOUS

## 2017-01-30 MED ORDER — ONDANSETRON HCL 4 MG/2ML IJ SOLN
INTRAMUSCULAR | Status: DC | PRN
Start: 2017-01-30 — End: 2017-01-30
  Administered 2017-01-30: 4 mg via INTRAVENOUS

## 2017-01-30 MED ORDER — GLYCOPYRROLATE 0.2 MG/ML IJ SOLN
INTRAMUSCULAR | Status: DC | PRN
  Administered 2017-01-30: 0.3 mg via INTRAVENOUS

## 2017-01-30 MED ORDER — DEXAMETHASONE SODIUM PHOSPHATE 4 MG/ML IJ SOLN
INTRAMUSCULAR | Status: DC | PRN
  Administered 2017-01-30: 10 mg via INTRAVENOUS

## 2017-01-30 MED ORDER — HYDROMORPHONE HCL 1 MG/ML IJ SOLN
0.2500 mg | INTRAMUSCULAR | Status: DC | PRN
  Administered 2017-01-30 (×3): 0.5 mg via INTRAVENOUS

## 2017-01-30 MED ORDER — MIDAZOLAM HCL 2 MG/2ML IJ SOLN
1.0000 mg | INTRAMUSCULAR | Status: DC | PRN
  Administered 2017-01-30: 2 mg via INTRAVENOUS

## 2017-01-30 MED ORDER — FENTANYL CITRATE (PF) 100 MCG/2ML IJ SOLN
INTRAMUSCULAR | Status: AC
  Filled 2017-01-30: qty 2

## 2017-01-30 MED ORDER — TOBRAMYCIN-DEXAMETHASONE 0.3-0.1 % OP OINT: 1.0000 "application " | TOPICAL_OINTMENT | Freq: Two times a day (BID) | OPHTHALMIC | 0 refills | Status: DC

## 2017-01-30 MED ORDER — PROPOFOL 10 MG/ML IV BOLUS: INTRAVENOUS | Status: DC | PRN

## 2017-01-30 MED ORDER — TOBRAMYCIN-DEXAMETHASONE 0.3-0.1 % OP OINT
TOPICAL_OINTMENT | OPHTHALMIC | Status: DC | PRN
  Administered 2017-01-30: 1 via OPHTHALMIC

## 2017-01-30 MED ORDER — KETOROLAC TROMETHAMINE 30 MG/ML IJ SOLN
INTRAMUSCULAR | Status: DC | PRN
  Administered 2017-01-30: 30 mg via INTRAVENOUS

## 2017-01-30 MED ORDER — LIDOCAINE HCL (CARDIAC) 20 MG/ML IV SOLN: INTRAVENOUS | Status: DC | PRN

## 2017-01-30 MED ORDER — SCOPOLAMINE 1 MG/3DAYS TD PT72
1.0000 | MEDICATED_PATCH | Freq: Once | TRANSDERMAL | Status: DC | PRN
Start: 2017-01-30 — End: 2017-01-30

## 2017-01-30 SURGICAL SUPPLY — 32 items
APPLICATOR COTTON TIP 6IN STRL (MISCELLANEOUS) ×12 IMPLANT
APPLICATOR DR MATTHEWS STRL (MISCELLANEOUS) ×3 IMPLANT
BANDAGE EYE OVAL (MISCELLANEOUS) IMPLANT
CAUTERY EYE LOW TEMP 1300F FIN (OPHTHALMIC RELATED) IMPLANT
CLOSURE WOUND 1/4X4 (GAUZE/BANDAGES/DRESSINGS)
COVER BACK TABLE 60X90IN (DRAPES) ×3 IMPLANT
COVER MAYO STAND STRL (DRAPES) ×3 IMPLANT
DRAPE SURG 17X23 STRL (DRAPES) ×6 IMPLANT
DRAPE U-SHAPE 76X120 STRL (DRAPES) ×3 IMPLANT
GLOVE BIO SURGEON STRL SZ 6.5 (GLOVE) ×4 IMPLANT
GLOVE BIO SURGEON STRL SZ8 (GLOVE) ×3 IMPLANT
GLOVE BIO SURGEONS STRL SZ 6.5 (GLOVE) ×2
GLOVE BIOGEL M STRL SZ7.5 (GLOVE) ×3 IMPLANT
GOWN STRL REUS W/ TWL LRG LVL3 (GOWN DISPOSABLE) ×1 IMPLANT
GOWN STRL REUS W/TWL LRG LVL3 (GOWN DISPOSABLE) ×2
GOWN STRL REUS W/TWL XL LVL3 (GOWN DISPOSABLE) ×6 IMPLANT
NS IRRIG 1000ML POUR BTL (IV SOLUTION) ×3 IMPLANT
PACK BASIN DAY SURGERY FS (CUSTOM PROCEDURE TRAY) ×3 IMPLANT
SHEET MEDIUM DRAPE 40X70 STRL (DRAPES) IMPLANT
SPEAR EYE SURG WECK-CEL (MISCELLANEOUS) ×9 IMPLANT
STRIP CLOSURE SKIN 1/4X4 (GAUZE/BANDAGES/DRESSINGS) IMPLANT
SUT 6 0 SILK T G140 8DA (SUTURE) IMPLANT
SUT MERSILENE 6-0 18IN S14 8MM (SUTURE)
SUT PLAIN 6 0 TG1408 (SUTURE) IMPLANT
SUT SILK 4 0 C 3 735G (SUTURE) IMPLANT
SUT VICRYL 6 0 S 28 (SUTURE) IMPLANT
SUT VICRYL ABS 6-0 S29 18IN (SUTURE) ×6 IMPLANT
SUTURE MERSLN 6-0 18IN S14 8MM (SUTURE) IMPLANT
SYR 10ML LL (SYRINGE) ×3 IMPLANT
SYR TB 1ML LL NO SAFETY (SYRINGE) ×3 IMPLANT
TOWEL OR 17X24 6PK STRL BLUE (TOWEL DISPOSABLE) ×3 IMPLANT
TRAY DSU PREP LF (CUSTOM PROCEDURE TRAY) ×3 IMPLANT

## 2017-01-30 NOTE — Interval H&P Note (Signed)
History and Physical Interval Note:  01/30/2017 11:47 AM  Matthew Romero  has presented today for surgery, with the diagnosis of ESOTROPIA   The various methods of treatment have been discussed with the patient and family. After consideration of risks, benefits and other options for treatment, the patient has consented to  Procedure(s): REPAIR STRABISMUS BILATERAL (Bilateral) as a surgical intervention .  The patient's history has been reviewed, patient examined, no change in status, stable for surgery.  I have reviewed the patient's chart and labs.  Questions were answered to the patient's satisfaction.     Derry Skill

## 2017-01-30 NOTE — Anesthesia Procedure Notes (Signed)
Procedure Name: LMA Insertion Date/Time: 01/30/2017 11:53 AM Performed by: Melynda Ripple D Pre-anesthesia Checklist: Patient identified, Emergency Drugs available, Suction available and Patient being monitored Patient Re-evaluated:Patient Re-evaluated prior to induction Oxygen Delivery Method: Circle system utilized Preoxygenation: Pre-oxygenation with 100% oxygen Induction Type: IV induction Ventilation: Mask ventilation without difficulty LMA: LMA flexible inserted LMA Size: 4.0 Number of attempts: 1 Airway Equipment and Method: Bite block Placement Confirmation: positive ETCO2 Tube secured with: Tape Dental Injury: Teeth and Oropharynx as per pre-operative assessment

## 2017-01-30 NOTE — Transfer of Care (Signed)
Immediate Anesthesia Transfer of Care Note  Patient: Matthew Romero  Procedure(s) Performed: Procedure(s): REPAIR STRABISMUS BILATERAL (Bilateral)  Patient Location: PACU  Anesthesia Type:General  Level of Consciousness: sedated  Airway & Oxygen Therapy: Patient Spontanous Breathing and Patient connected to face mask oxygen  Post-op Assessment: Report given to RN and Post -op Vital signs reviewed and stable  Post vital signs: Reviewed and stable  Last Vitals:  Vitals:   01/30/17 1013  BP: 111/69  Pulse: (!) 50  Resp: 18  Temp: 36.6 C    Last Pain:  Vitals:   01/30/17 1013  TempSrc: Oral         Complications: No apparent anesthesia complications

## 2017-01-30 NOTE — H&P (View-Only) (Signed)
Date of examination:  01-19-17  Indication for surgery: to straighten the eyes and allow some binocularity  Pertinent past medical history:  Past Medical History:  Diagnosis Date  . ADD (attention deficit disorder)   . Esotropia of both eyes 12/2016  . Exercise-induced asthma    prn inhaler  . Major depressive disorder   . Sleep apnea    no  CPAP use    Pertinent ocular history:  ET first noticed 6-8 years ago.  MRI negative.  Myopic not overminused  Pertinent family history:  Family History  Problem Relation Age of Onset  . ADD / ADHD Mother   . Anxiety disorder Mother   . Depression Father   . ADD / ADHD Brother   . Depression Brother   . ADD / ADHD Brother   . Alcohol abuse Cousin   . Drug abuse Cousin     General:  Healthy appearing patient in no distress.    Eyes:    Acuity  cc OD 20/20  OS 20/20  External: Within normal limits     Anterior segment: Within normal limits     Motility:   Cc ET=25 comitant, ET' 25  Fundus: Normal     Refraction: -2 OU approx  Heart: Regular rate and rhythm without murmur     Lungs: Clear to auscultation     Impression:Esotropia, acquired, nonaccommodative  Myopia, not overminused  Plan: Medial rectus muscle recession both eyes  Matthew Romero O

## 2017-01-30 NOTE — Op Note (Signed)
01/30/2017  1:29 PM  PATIENT:  Matthew Romero  26 y.o. male  PRE-OPERATIVE DIAGNOSIS:  Esotropia     POST-OPERATIVE DIAGNOSIS:  Esotropia     PROCEDURE:  Medial rectus muscle recession  4.5 mm both eye(s)  SURGEON:  Lorne Skeens.Annamaria Boots, M.D.   ANESTHESIA:   general  COMPLICATIONS:None  DESCRIPTION OF PROCEDURE: The patient was taken to the operating room where He was identified by me. General anesthesia was induced without difficulty after placement of appropriate monitors. The patient was prepped and draped in standard sterile fashion. A lid speculum was placed in the left eye.  Through an inferonasal fornix incision through conjunctiva and Tenon's fascia, the left medial rectus muscle was engaged on a series of muscle hooks and cleared of its fascial attachments. The tendon was secured with a double-armed 6-0 Vicryl suture with a double locking bite at each border of the muscle, 1 mm from the insertion. The muscle was disinserted, and was reattached to sclera at a measured distance of 4.5 millimeters posterior to the original insertion, using direct scleral passes in crossed swords fashion.  The suture ends were tied securely after the position of the muscle had been checked and found to be accurate. Conjunctiva was closed with 2 6-0 Vicryl sutures.  The speculum was transferred to the right eye, where an identical procedure was performed, again effecting a 4.5 millimeters recession of the medial rectus muscle. TobraDex ointment was placed in both eye(s). The patient was awakened without difficulty and taken to the recovery room in stable condition, having suffered no intraoperative or immediate postoperative complications.  Lorne Skeens. Dyllen Menning M.D.    PATIENT DISPOSITION:  PACU - hemodynamically stable.

## 2017-01-30 NOTE — Anesthesia Preprocedure Evaluation (Addendum)
Anesthesia Evaluation  Patient identified by MRN, date of birth, ID band Patient awake    Reviewed: Allergy & Precautions, H&P , NPO status , Patient's Chart, lab work & pertinent test results  Airway Mallampati: II  TM Distance: >3 FB Neck ROM: Full    Dental no notable dental hx. (+) Teeth Intact, Dental Advisory Given   Pulmonary asthma , sleep apnea , Current Smoker,    Pulmonary exam normal breath sounds clear to auscultation       Cardiovascular negative cardio ROS   Rhythm:Regular Rate:Normal     Neuro/Psych Depression negative neurological ROS  negative psych ROS   GI/Hepatic negative GI ROS, Neg liver ROS,   Endo/Other  negative endocrine ROS  Renal/GU negative Renal ROS  negative genitourinary   Musculoskeletal   Abdominal   Peds  Hematology negative hematology ROS (+)   Anesthesia Other Findings   Reproductive/Obstetrics negative OB ROS                            Anesthesia Physical Anesthesia Plan  ASA: II  Anesthesia Plan: General   Post-op Pain Management:    Induction: Intravenous  PONV Risk Score and Plan: 2 and Ondansetron, Dexamethasone and Midazolam  Airway Management Planned: LMA  Additional Equipment:   Intra-op Plan:   Post-operative Plan: Extubation in OR  Informed Consent: I have reviewed the patients History and Physical, chart, labs and discussed the procedure including the risks, benefits and alternatives for the proposed anesthesia with the patient or authorized representative who has indicated his/her understanding and acceptance.   Dental advisory given  Plan Discussed with: CRNA  Anesthesia Plan Comments:         Anesthesia Quick Evaluation

## 2017-01-30 NOTE — Discharge Instructions (Signed)
°  Post Anesthesia Home Care Instructions  Activity: Get plenty of rest for the remainder of the day. A responsible individual must stay with you for 24 hours following the procedure.  For the next 24 hours, DO NOT: -Drive a car -Paediatric nurse -Drink alcoholic beverages -Take any medication unless instructed by your physician -Make any legal decisions or sign important papers.  Meals: Start with liquid foods such as gelatin or soup. Progress to regular foods as tolerated. Avoid greasy, spicy, heavy foods. If nausea and/or vomiting occur, drink only clear liquids until the nausea and/or vomiting subsides. Call your physician if vomiting continues.  Special Instructions/Symptoms: Your throat may feel dry or sore from the anesthesia or the breathing tube placed in your throat during surgery. If this causes discomfort, gargle with warm salt water. The discomfort should disappear within 24 hours.  If you had a scopolamine patch placed behind your ear for the management of post- operative nausea and/or vomiting:  1. The medication in the patch is effective for 72 hours, after which it should be removed.  Wrap patch in a tissue and discard in the trash. Wash hands thoroughly with soap and water. 2. You may remove the patch earlier than 72 hours if you experience unpleasant side effects which may include dry mouth, dizziness or visual disturbances. 3. Avoid touching the patch. Wash your hands with soap and water after contact with the patch.       Diet: Clear liquids, advance to soft foods then regular diet as tolerated by this evening.  Pain control:   1)  Ibuprofen 600 mg by mouth every 6-8 hours as needed for pain  2)  Ice pack/cold compress to operated eye(s) as desired  Eye medications:   Tobradex or Zylet eye ointment 1/2 inch in operated eye(s) twice a day if directed to do so by Dr. Annamaria Boots  Activity: No swimming for 1 week.  It is OK to let water run over the face and eyes while  showering or taking a bath, even during the first week.  No other restriction on exercise or activity.  Call Dr. Janee Morn office [phone] with any problems or concerns.

## 2017-01-31 NOTE — Anesthesia Postprocedure Evaluation (Signed)
Anesthesia Post Note  Patient: Matthew Romero  Procedure(s) Performed: Procedure(s) (LRB): REPAIR STRABISMUS BILATERAL (Bilateral)     Patient location during evaluation: PACU Anesthesia Type: General Level of consciousness: awake, awake and alert and oriented Pain management: pain level controlled Vital Signs Assessment: post-procedure vital signs reviewed and stable Respiratory status: spontaneous breathing, nonlabored ventilation and respiratory function stable Cardiovascular status: blood pressure returned to baseline Postop Assessment: no headache Anesthetic complications: no    Last Vitals:  Vitals:   01/30/17 1430 01/30/17 1502  BP: 123/75 131/66  Pulse: 85 81  Resp: 12 16  Temp:  36.7 C    Last Pain:  Vitals:   01/30/17 1502  TempSrc: Oral  PainSc: 2                  Tyrell Brereton COKER

## 2017-02-02 ENCOUNTER — Encounter (HOSPITAL_BASED_OUTPATIENT_CLINIC_OR_DEPARTMENT_OTHER): Payer: Self-pay | Admitting: Ophthalmology

## 2017-02-03 ENCOUNTER — Encounter (HOSPITAL_BASED_OUTPATIENT_CLINIC_OR_DEPARTMENT_OTHER): Payer: Self-pay | Admitting: Ophthalmology

## 2017-02-03 MED ORDER — LIDOCAINE HCL (CARDIAC) 20 MG/ML IV SOLN
INTRAVENOUS | Status: DC | PRN
  Administered 2017-01-30: 30 mg via INTRAVENOUS

## 2017-02-03 MED ORDER — PROPOFOL 10 MG/ML IV BOLUS
INTRAVENOUS | Status: DC | PRN
  Administered 2017-01-30: 200 mg via INTRAVENOUS

## 2017-02-03 NOTE — Addendum Note (Signed)
Addendum  created 02/03/17 1439 by Willa Frater, CRNA   Anesthesia Event edited, Anesthesia Intra Flowsheets edited, Anesthesia Intra Meds edited, Anesthesia Staff edited

## 2017-02-13 ENCOUNTER — Ambulatory Visit: Payer: Self-pay | Admitting: Family Medicine

## 2017-03-26 ENCOUNTER — Encounter: Payer: Self-pay | Admitting: Family Medicine

## 2017-03-26 ENCOUNTER — Ambulatory Visit (INDEPENDENT_AMBULATORY_CARE_PROVIDER_SITE_OTHER): Payer: 59 | Admitting: Family Medicine

## 2017-03-26 ENCOUNTER — Telehealth: Payer: Self-pay | Admitting: Family Medicine

## 2017-03-26 VITALS — BP 100/54 | HR 57 | Temp 97.7°F | Resp 16 | Wt 199.0 lb

## 2017-03-26 DIAGNOSIS — M778 Other enthesopathies, not elsewhere classified: Secondary | ICD-10-CM | POA: Diagnosis not present

## 2017-03-26 DIAGNOSIS — IMO0002 Reserved for concepts with insufficient information to code with codable children: Secondary | ICD-10-CM

## 2017-03-26 DIAGNOSIS — Z23 Encounter for immunization: Secondary | ICD-10-CM

## 2017-03-26 DIAGNOSIS — G4733 Obstructive sleep apnea (adult) (pediatric): Secondary | ICD-10-CM

## 2017-03-26 NOTE — Telephone Encounter (Signed)
Patient had CPAP titration ordered in may, but he didn't follow through due to cost of test at the time. He would like to get it rescheduled. Can you please contact [facility] and have it rescheduled. Thanks.

## 2017-03-26 NOTE — Progress Notes (Signed)
Patient: Matthew Romero Male    DOB: [birth year]   25 y.o.   MRN: [id number] Visit Date: 03/26/2017  Today's Provider: Lelon Huh, MD   Chief Complaint  Patient presents with  . Follow-up   Subjective:    HPI  Hypersomnia From 10/17/2016-Home sleep test ordered showing moderate sleep apnea with AHI of 15. Recommended trail of CPAP. Test was ordered but he cancelled due to confusion about cost of test. He would like to proceed with test. He continues to have low energy level and feels tired during waking hours. He has been working full time second shift at [employer] since around May and feels later shift actually is well suited to his sleeping habits. 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 He feels his mood has been good, not feeling depressed or anxious. He does admit to fidgeting a lot during the day, but does not feel that fidgeting or leg movement interfere with sleeping. Marland Kitchen   He also states the back of his elbow hurts when he fully extends or hyperextends it. It's been bother him a few weeks, but not interfering with work and is mild in intensity.  No Known Allergies   Current Outpatient Prescriptions:  .  albuterol (PROVENTIL HFA;VENTOLIN HFA) 108 (90 Base) MCG/ACT inhaler, Inhale 2 puffs into the lungs every 6 (six) hours as needed for wheezing or shortness of breath., Disp: , Rfl:  .  amphetamine-dextroamphetamine (ADDERALL XR) 20 MG 24 hr capsule, Take 40 mg by mouth daily., Disp: , Rfl:  .  amphetamine-dextroamphetamine (ADDERALL) 20 MG tablet, Take 20 mg by mouth daily., Disp: , Rfl:  .  cholecalciferol (VITAMIN D) 1000 units tablet, Take 1,000 Units by mouth daily., Disp: , Rfl:  .  FLUoxetine (PROZAC) 40 MG capsule, Take 80 mg by mouth daily., Disp: , Rfl:  .  L-ARGININE PO, Take by mouth., Disp: , Rfl:  .  LECITHIN-19 PO, Take by mouth., Disp: , Rfl:  .   LINOLEIC ACID-SUNFLOWER OIL PO, Take by mouth., Disp: , Rfl:  .  Multiple Vitamins-Minerals (ZINC PO), Take by mouth., Disp: , Rfl:  .  NETTLE-PYGEUM AFRICANUM PO, Take by mouth., Disp: , Rfl:  .  traZODone (DESYREL) 150 MG tablet, Take 150 mg by mouth at bedtime., Disp: , Rfl:   Review of Systems  Constitutional: Negative for appetite change, chills and fever.  Respiratory: Negative for chest tightness, shortness of breath and wheezing.   Cardiovascular: Negative for chest pain and palpitations.  Gastrointestinal: Negative for abdominal pain, nausea and vomiting.    Social History  Substance Use Topics  . Smoking status: Current Some Day Smoker    Years: 6.00    Types: Cigars  . Smokeless tobacco: Never Used     Comment: 1 cigar/month  . Alcohol use Yes     Comment: occasionally   Objective:   BP (!) 100/54 (BP Location: Right Arm, Patient Position: Sitting, Cuff Size: Large)   Pulse (!) 57   Temp 97.7 F (36.5 C) (Oral)   Resp 16   Wt 199 lb (90.3 kg)   SpO2 95%   BMI 24.87 kg/m  Vitals:   03/26/17 1057  BP: (!) 100/54  Pulse: (!) 57  Resp: 16  Temp: 97.7 F (36.5 C)  TempSrc: Oral  SpO2: 95%  Weight: 199 lb (90.3  kg)     Physical Exam  General appearance: alert, well developed, well nourished, cooperative and in no distress Head: Normocephalic, without obvious abnormality, atraumatic Respiratory: Respirations even and unlabored, normal respiratory rate Extremities: No gross deformities. No tenderness of olecranon or epicondyles. No swelling or erythema. Slight pain with hyper extension, but no pain with pronation, supination, or extension of elbow in normal range.  Skin: Skin color, texture, turgor normal. No rashes seen  Psych: Appropriate mood and affect. Neurologic: Mental status: Alert, oriented to person, place, and time, thought content appropriate.     Assessment & Plan:     1. OSA (obstructive sleep apnea) Will reschedule CPAP study.   2.  Tendonitis of elbow or forearm Mild an will likely resolve with relative rest, apply ice if he uses UE more than usual.   3. Need for influenza vaccination  - Flu Vaccine QUAD 36+ mos IM       Lelon Huh, MD  [facility]

## 2017-03-30 NOTE — Telephone Encounter (Signed)
Left message for Jeneen Rinks at [facility] to return call

## 2017-03-31 NOTE — Telephone Encounter (Signed)
Order for CPAP faxed to [facility].  

## 2017-04-23 DIAGNOSIS — G4733 Obstructive sleep apnea (adult) (pediatric): Secondary | ICD-10-CM | POA: Diagnosis not present

## 2017-04-29 DIAGNOSIS — F900 Attention-deficit hyperactivity disorder, predominantly inattentive type: Secondary | ICD-10-CM | POA: Diagnosis not present

## 2017-04-29 DIAGNOSIS — F341 Dysthymic disorder: Secondary | ICD-10-CM | POA: Diagnosis not present

## 2017-04-29 DIAGNOSIS — F5105 Insomnia due to other mental disorder: Secondary | ICD-10-CM | POA: Diagnosis not present

## 2017-04-30 DIAGNOSIS — H5000 Unspecified esotropia: Secondary | ICD-10-CM

## 2017-04-30 DIAGNOSIS — H50011 Monocular esotropia, right eye: Secondary | ICD-10-CM

## 2017-04-30 HISTORY — DX: Monocular esotropia, right eye: H50.011

## 2017-04-30 HISTORY — DX: Unspecified esotropia: H50.00

## 2017-05-11 DIAGNOSIS — H5032 Intermittent alternating esotropia: Secondary | ICD-10-CM | POA: Diagnosis not present

## 2017-05-24 DIAGNOSIS — G4733 Obstructive sleep apnea (adult) (pediatric): Secondary | ICD-10-CM | POA: Diagnosis not present

## 2017-05-29 ENCOUNTER — Encounter (HOSPITAL_BASED_OUTPATIENT_CLINIC_OR_DEPARTMENT_OTHER): Payer: Self-pay | Admitting: *Deleted

## 2017-05-29 ENCOUNTER — Other Ambulatory Visit: Payer: Self-pay

## 2017-06-01 ENCOUNTER — Ambulatory Visit: Payer: Self-pay | Admitting: Ophthalmology

## 2017-06-02 ENCOUNTER — Ambulatory Visit: Payer: Self-pay | Admitting: Ophthalmology

## 2017-06-02 NOTE — H&P (View-Only) (Signed)
Date of examination:  05-11-17  Indication for surgery: to straighten the eyes and relieve diplopia  Pertinent past medical history:  Past Medical History:  Diagnosis Date  . ADD (attention deficit disorder)   . Esotropia of right eye 04/2017  . Exercise-induced asthma    prn inhaler  . Major depressive disorder   . Sleep apnea    uses CPAP nightly    Pertinent ocular history:  ET x 6-8 years.  MR recess OU 8/18.  Residual E(T)  Pertinent family history:  Family History  Problem Relation Age of Onset  . ADD / ADHD Mother   . Anxiety disorder Mother   . Depression Father   . ADD / ADHD Brother   . Depression Brother   . ADD / ADHD Brother   . Alcohol abuse Cousin   . Drug abuse Cousin     General:  Healthy appearing patient in no distress.    Eyes:    Acuity  cc OD 20/20  OS 20/20  External: Within normal limits     Anterior segment: Within normal limits   X healed conj scars ou  Motility:   E(T)=E(T)'=10, comitant  Fundus: Normal     Refraction:  -2 OU approx  Heart: Regular rate and rhythm without murmur     Lungs: Clear to auscultation     Impression:Esotropia, residual, s/p medial rectus muscle recession OU  Plan: Right lateral rectus muscle resection  Derry Skill

## 2017-06-02 NOTE — H&P (Signed)
Date of examination:  05-11-17  Indication for surgery: to straighten the eyes and relieve diplopia  Pertinent past medical history:  Past Medical History:  Diagnosis Date  . ADD (attention deficit disorder)   . Esotropia of right eye 04/2017  . Exercise-induced asthma    prn inhaler  . Major depressive disorder   . Sleep apnea    uses CPAP nightly    Pertinent ocular history:  ET x 6-8 years.  MR recess OU 8/18.  Residual E(T)  Pertinent family history:  Family History  Problem Relation Age of Onset  . ADD / ADHD Mother   . Anxiety disorder Mother   . Depression Father   . ADD / ADHD Brother   . Depression Brother   . ADD / ADHD Brother   . Alcohol abuse Cousin   . Drug abuse Cousin     General:  Healthy appearing patient in no distress.    Eyes:    Acuity  cc OD 20/20  OS 20/20  External: Within normal limits     Anterior segment: Within normal limits   X healed conj scars ou  Motility:   E(T)=E(T)'=10, comitant  Fundus: Normal     Refraction:  -2 OU approx  Heart: Regular rate and rhythm without murmur     Lungs: Clear to auscultation     Impression:Esotropia, residual, s/p medial rectus muscle recession OU  Plan: Right lateral rectus muscle resection  Derry Skill

## 2017-06-05 ENCOUNTER — Other Ambulatory Visit: Payer: Self-pay

## 2017-06-05 ENCOUNTER — Ambulatory Visit (HOSPITAL_BASED_OUTPATIENT_CLINIC_OR_DEPARTMENT_OTHER): Payer: 59 | Admitting: Anesthesiology

## 2017-06-05 ENCOUNTER — Ambulatory Visit (HOSPITAL_BASED_OUTPATIENT_CLINIC_OR_DEPARTMENT_OTHER)
Admission: RE | Admit: 2017-06-05 | Discharge: 2017-06-05 | Disposition: A | Discharge status: Home / Self Care | Payer: 59 | Source: Ambulatory Visit | Attending: Ophthalmology | Admitting: Ophthalmology

## 2017-06-05 ENCOUNTER — Encounter (HOSPITAL_BASED_OUTPATIENT_CLINIC_OR_DEPARTMENT_OTHER): Payer: Self-pay | Admitting: *Deleted

## 2017-06-05 ENCOUNTER — Encounter (HOSPITAL_BASED_OUTPATIENT_CLINIC_OR_DEPARTMENT_OTHER)
Admission: RE | Disposition: A | Discharge status: Home / Self Care | Payer: Self-pay | Source: Ambulatory Visit | Attending: Ophthalmology

## 2017-06-05 DIAGNOSIS — Z9989 Dependence on other enabling machines and devices: Secondary | ICD-10-CM | POA: Insufficient documentation

## 2017-06-05 DIAGNOSIS — J45909 Unspecified asthma, uncomplicated: Secondary | ICD-10-CM | POA: Diagnosis not present

## 2017-06-05 DIAGNOSIS — H532 Diplopia: Secondary | ICD-10-CM | POA: Diagnosis not present

## 2017-06-05 DIAGNOSIS — G4730 Sleep apnea, unspecified: Secondary | ICD-10-CM | POA: Insufficient documentation

## 2017-06-05 DIAGNOSIS — H5000 Unspecified esotropia: Secondary | ICD-10-CM | POA: Diagnosis not present

## 2017-06-05 DIAGNOSIS — G4733 Obstructive sleep apnea (adult) (pediatric): Secondary | ICD-10-CM | POA: Diagnosis not present

## 2017-06-05 DIAGNOSIS — F329 Major depressive disorder, single episode, unspecified: Secondary | ICD-10-CM | POA: Diagnosis not present

## 2017-06-05 DIAGNOSIS — H5032 Intermittent alternating esotropia: Secondary | ICD-10-CM | POA: Diagnosis not present

## 2017-06-05 DIAGNOSIS — F17200 Nicotine dependence, unspecified, uncomplicated: Secondary | ICD-10-CM | POA: Diagnosis not present

## 2017-06-05 HISTORY — PX: STRABISMUS SURGERY: SHX218

## 2017-06-05 HISTORY — DX: Unspecified esotropia: H50.00

## 2017-06-05 SURGERY — REPAIR STRABISMUS
Anesthesia: General | Site: Eye | Laterality: Right

## 2017-06-05 MED ORDER — ONDANSETRON HCL 4 MG/2ML IJ SOLN
INTRAMUSCULAR | Status: DC | PRN
  Administered 2017-06-05: 4 mg via INTRAVENOUS

## 2017-06-05 MED ORDER — TOBRAMYCIN-DEXAMETHASONE 0.3-0.1 % OP OINT
TOPICAL_OINTMENT | OPHTHALMIC | Status: DC | PRN
  Administered 2017-06-05: 1 via OPHTHALMIC

## 2017-06-05 MED ORDER — PROPOFOL 10 MG/ML IV BOLUS
INTRAVENOUS | Status: DC | PRN
  Administered 2017-06-05: 300 mg via INTRAVENOUS

## 2017-06-05 MED ORDER — MIDAZOLAM HCL 2 MG/2ML IJ SOLN
1.0000 mg | INTRAMUSCULAR | Status: DC | PRN
  Administered 2017-06-05: 2 mg via INTRAVENOUS

## 2017-06-05 MED ORDER — LIDOCAINE HCL (CARDIAC) 20 MG/ML IV SOLN
INTRAVENOUS | Status: DC | PRN
  Administered 2017-06-05: 100 mg via INTRAVENOUS

## 2017-06-05 MED ORDER — PHENYLEPHRINE 40 MCG/ML (10ML) SYRINGE FOR IV PUSH (FOR BLOOD PRESSURE SUPPORT)
PREFILLED_SYRINGE | INTRAVENOUS | Status: AC
  Filled 2017-06-05: qty 10

## 2017-06-05 MED ORDER — LACTATED RINGERS IV SOLN
INTRAVENOUS | Status: DC
Start: 2017-06-05 — End: 2017-06-05
  Administered 2017-06-05 (×2): via INTRAVENOUS

## 2017-06-05 MED ORDER — SCOPOLAMINE 1 MG/3DAYS TD PT72: 1.0000 | MEDICATED_PATCH | Freq: Once | TRANSDERMAL | Status: DC | PRN

## 2017-06-05 MED ORDER — HYDROMORPHONE HCL 1 MG/ML IJ SOLN
INTRAMUSCULAR | Status: AC
  Filled 2017-06-05: qty 0.5

## 2017-06-05 MED ORDER — KETOROLAC TROMETHAMINE 30 MG/ML IJ SOLN
INTRAMUSCULAR | Status: AC
  Filled 2017-06-05: qty 1

## 2017-06-05 MED ORDER — DEXAMETHASONE SODIUM PHOSPHATE 4 MG/ML IJ SOLN
INTRAMUSCULAR | Status: DC | PRN
  Administered 2017-06-05: 10 mg via INTRAVENOUS

## 2017-06-05 MED ORDER — PROMETHAZINE HCL 25 MG/ML IJ SOLN: 6.2500 mg | INTRAMUSCULAR | Status: DC | PRN

## 2017-06-05 MED ORDER — ONDANSETRON HCL 4 MG/2ML IJ SOLN
INTRAMUSCULAR | Status: AC
  Filled 2017-06-05: qty 2

## 2017-06-05 MED ORDER — FENTANYL CITRATE (PF) 100 MCG/2ML IJ SOLN
INTRAMUSCULAR | Status: AC
  Filled 2017-06-05: qty 2

## 2017-06-05 MED ORDER — MIDAZOLAM HCL 2 MG/2ML IJ SOLN
INTRAMUSCULAR | Status: AC
  Filled 2017-06-05: qty 2

## 2017-06-05 MED ORDER — DEXAMETHASONE SODIUM PHOSPHATE 10 MG/ML IJ SOLN
INTRAMUSCULAR | Status: AC
  Filled 2017-06-05: qty 1

## 2017-06-05 MED ORDER — KETOROLAC TROMETHAMINE 30 MG/ML IJ SOLN
INTRAMUSCULAR | Status: DC | PRN
  Administered 2017-06-05: 30 mg via INTRAVENOUS

## 2017-06-05 MED ORDER — FENTANYL CITRATE (PF) 100 MCG/2ML IJ SOLN
50.0000 ug | INTRAMUSCULAR | Status: DC | PRN
  Administered 2017-06-05: 100 ug via INTRAVENOUS

## 2017-06-05 MED ORDER — EPHEDRINE 5 MG/ML INJ
INTRAVENOUS | Status: AC
  Filled 2017-06-05: qty 10

## 2017-06-05 MED ORDER — GLYCOPYRROLATE 0.2 MG/ML IJ SOLN
INTRAMUSCULAR | Status: DC | PRN
  Administered 2017-06-05: 0.3 mg via INTRAVENOUS

## 2017-06-05 MED ORDER — HYDROMORPHONE HCL 1 MG/ML IJ SOLN
0.2500 mg | INTRAMUSCULAR | Status: DC | PRN
Start: 2017-06-05 — End: 2017-06-05
  Administered 2017-06-05 (×2): 0.5 mg via INTRAVENOUS

## 2017-06-05 MED ORDER — LIDOCAINE 2% (20 MG/ML) 5 ML SYRINGE
INTRAMUSCULAR | Status: AC
  Filled 2017-06-05: qty 5

## 2017-06-05 MED ORDER — SUCCINYLCHOLINE CHLORIDE 200 MG/10ML IV SOSY
PREFILLED_SYRINGE | INTRAVENOUS | Status: AC
  Filled 2017-06-05: qty 10

## 2017-06-05 SURGICAL SUPPLY — 31 items
APPLICATOR COTTON TIP 6IN STRL (MISCELLANEOUS) ×12 IMPLANT
APPLICATOR DR MATTHEWS STRL (MISCELLANEOUS) ×3 IMPLANT
BANDAGE EYE OVAL (MISCELLANEOUS) IMPLANT
CAUTERY EYE LOW TEMP 1300F FIN (OPHTHALMIC RELATED) IMPLANT
CLOSURE WOUND 1/4X4 (GAUZE/BANDAGES/DRESSINGS)
COVER BACK TABLE 60X90IN (DRAPES) ×3 IMPLANT
COVER MAYO STAND STRL (DRAPES) ×3 IMPLANT
DRAPE SURG 17X23 STRL (DRAPES) ×6 IMPLANT
DRAPE U-SHAPE 76X120 STRL (DRAPES) IMPLANT
GLOVE BIO SURGEON STRL SZ 6.5 (GLOVE) ×4 IMPLANT
GLOVE BIO SURGEONS STRL SZ 6.5 (GLOVE) ×2
GLOVE BIOGEL M STRL SZ7.5 (GLOVE) ×3 IMPLANT
GOWN STRL REUS W/ TWL LRG LVL3 (GOWN DISPOSABLE) ×1 IMPLANT
GOWN STRL REUS W/TWL LRG LVL3 (GOWN DISPOSABLE) ×2
GOWN STRL REUS W/TWL XL LVL3 (GOWN DISPOSABLE) ×6 IMPLANT
NS IRRIG 1000ML POUR BTL (IV SOLUTION) ×3 IMPLANT
PACK BASIN DAY SURGERY FS (CUSTOM PROCEDURE TRAY) ×3 IMPLANT
SHEET MEDIUM DRAPE 40X70 STRL (DRAPES) IMPLANT
SPEAR EYE SURG WECK-CEL (MISCELLANEOUS) ×6 IMPLANT
STRIP CLOSURE SKIN 1/4X4 (GAUZE/BANDAGES/DRESSINGS) IMPLANT
SUT 6 0 SILK T G140 8DA (SUTURE) IMPLANT
SUT MERSILENE 6-0 18IN S14 8MM (SUTURE)
SUT PLAIN 6 0 TG1408 (SUTURE) ×3 IMPLANT
SUT SILK 4 0 C 3 735G (SUTURE) IMPLANT
SUT VICRYL 6 0 S 28 (SUTURE) ×3 IMPLANT
SUT VICRYL ABS 6-0 S29 18IN (SUTURE) IMPLANT
SUTURE MERSLN 6-0 18IN S14 8MM (SUTURE) IMPLANT
SYR 10ML LL (SYRINGE) ×3 IMPLANT
SYR TB 1ML LL NO SAFETY (SYRINGE) ×3 IMPLANT
TOWEL OR 17X24 6PK STRL BLUE (TOWEL DISPOSABLE) ×3 IMPLANT
TRAY DSU PREP LF (CUSTOM PROCEDURE TRAY) ×3 IMPLANT

## 2017-06-05 NOTE — Op Note (Signed)
06/05/2017  11:43 AM  PATIENT:  Matthew Romero    PRE-OPERATIVE DIAGNOSIS:  Esotropia, residual  POST-OPERATIVE DIAGNOSIS:  Esotropia, residual  PROCEDURE:  Lateral rectus muscle resection, 5.5 mm right eye  SURGEON:  Derry Skill, MD  ANESTHESIA:   General  COMPLICATIONS: none  OPERATIVE PROCEDURE: After routine preoperative evaluation including informed consent from the parents, the patient was taken to the operating room where He was identified by me. General anesthesia was induced without difficulty after placement of appropriate monitors. The patient was prepped and draped in standard sterile fashion.A lid speculum was placed in the right eye.  Through an inferotemporal fornix incision through conjunctiva and Tenon's fascia, the right medial rectus muscle was engaged on a series of muscle hooks and cleared of its fascial attachments to at least 15 mm posterior to the insertion. The muscle was spread between 2 self-retaining hooks. A 2 mm bite was taken of the center of the muscle belly at a measured distance of 5.5 mm posterior to the insertion, and a knot was tied securely at this location. The needle at each end of the double-armed 6-0 Vicryl suture was passed from the center of the muscle belly to the periphery, parallel to and 5.5 mm posterior to the insertion. A locking bite was placed at each border of the muscle. A resection clamp was placed on the muscle just anterior to the sutures. The muscle was disinserted. Each pole suture was passed posteriorly to anteriorly through the corresponding end of the muscle stump, then anteriorly to posteriorly near the center of the stump, then posteriorly to anteriorly through the center of the muscle belly, just posterior to the previously placed knot. The muscle was drawn up to the level of the original insertion. The resection clamp was removed. All slack was removed before the suture ends were tied securely. The portion of the muscle  anterior to the sutures was carefully excised. Conjunctiva was closed with a single 6-0 plain gut suture. Tobradex ophthalmic ointment was placed in the right eye. The patient was awakened without difficulty and taken to the recovery room in stable condition, having suffered no intraoperative or immediate postoperative competitions.   Derry Skill, MD

## 2017-06-05 NOTE — Transfer of Care (Signed)
Immediate Anesthesia Transfer of Care Note  Patient: Matthew Romero  Procedure(s) Performed: REPAIR STRABISMUS RIGHT EYE (Right Eye)  Patient Location: PACU  Anesthesia Type:General  Level of Consciousness: awake, alert  and oriented  Airway & Oxygen Therapy: Patient Spontanous Breathing and Patient connected to face mask oxygen  Post-op Assessment: Report given to RN and Post -op Vital signs reviewed and stable  Post vital signs: Reviewed and stable  Last Vitals:  Vitals:   06/05/17 1010  BP: 113/68  Pulse: (!) 50  Resp: 18  Temp: 36.7 C  SpO2: 99%    Last Pain:  Vitals:   06/05/17 1010  TempSrc: Oral      Patients Stated Pain Goal: 0 (20/10/07 1219)  Complications: No apparent anesthesia complications

## 2017-06-05 NOTE — Anesthesia Preprocedure Evaluation (Signed)
Anesthesia Evaluation  Patient identified by MRN, date of birth, ID band Patient awake    Reviewed: Allergy & Precautions, NPO status   Airway Mallampati: II  TM Distance: >3 FB Neck ROM: Full    Dental  (+) Dental Advisory Given   Pulmonary asthma , sleep apnea and Continuous Positive Airway Pressure Ventilation , Current Smoker,    breath sounds clear to auscultation       Cardiovascular negative cardio ROS   Rhythm:Regular Rate:Normal     Neuro/Psych Depression negative neurological ROS     GI/Hepatic negative GI ROS, Neg liver ROS,   Endo/Other  negative endocrine ROS  Renal/GU negative Renal ROS     Musculoskeletal   Abdominal   Peds  Hematology negative hematology ROS (+)   Anesthesia Other Findings   Reproductive/Obstetrics                             Anesthesia Physical Anesthesia Plan  ASA: II  Anesthesia Plan: General   Post-op Pain Management:    Induction: Intravenous  PONV Risk Score and Plan: 1 and Ondansetron, Dexamethasone and Treatment may vary due to age or medical condition  Airway Management Planned: LMA  Additional Equipment:   Intra-op Plan:   Post-operative Plan: Extubation in OR  Informed Consent: I have reviewed the patients History and Physical, chart, labs and discussed the procedure including the risks, benefits and alternatives for the proposed anesthesia with the patient or authorized representative who has indicated his/her understanding and acceptance.     Plan Discussed with: CRNA  Anesthesia Plan Comments:         Anesthesia Quick Evaluation

## 2017-06-05 NOTE — Interval H&P Note (Signed)
History and Physical Interval Note:  06/05/2017 10:28 AM  Matthew Romero  has presented today for surgery, with the diagnosis of ESOTROPIA  The various methods of treatment have been discussed with the patient and family. After consideration of risks, benefits and other options for treatment, the patient has consented to  Procedure(s): REPAIR STRABISMUS RIGHT EYE (Right) as a surgical intervention .  The patient's history has been reviewed, patient examined, no change in status, stable for surgery.  I have reviewed the patient's chart and labs.  Questions were answered to the patient's satisfaction.     Derry Skill

## 2017-06-05 NOTE — Anesthesia Postprocedure Evaluation (Signed)
Anesthesia Post Note  Patient: Matthew Romero  Procedure(s) Performed: REPAIR STRABISMUS RIGHT EYE (Right Eye)     Patient location during evaluation: PACU Anesthesia Type: General Level of consciousness: awake and alert Pain management: pain level controlled Vital Signs Assessment: post-procedure vital signs reviewed and stable Respiratory status: spontaneous breathing, nonlabored ventilation, respiratory function stable and patient connected to nasal cannula oxygen Cardiovascular status: blood pressure returned to baseline and stable Postop Assessment: no apparent nausea or vomiting Anesthetic complications: no    Last Vitals:  Vitals:   06/05/17 1215 06/05/17 1234  BP: 110/71 125/63  Pulse: 77 79  Resp: (!) 9 18  Temp:  36.6 C  SpO2: 97% 100%    Last Pain:  Vitals:   06/05/17 1234  TempSrc: Oral  PainSc: 2                  Tiajuana Amass

## 2017-06-05 NOTE — Anesthesia Procedure Notes (Signed)
Procedure Name: LMA Insertion Date/Time: 06/05/2017 10:47 AM Performed by: Willa Frater, CRNA Pre-anesthesia Checklist: Patient identified, Emergency Drugs available, Suction available and Patient being monitored Patient Re-evaluated:Patient Re-evaluated prior to induction Oxygen Delivery Method: Circle system utilized Preoxygenation: Pre-oxygenation with 100% oxygen Induction Type: IV induction Ventilation: Mask ventilation without difficulty LMA: LMA inserted LMA Size: 4.0 Number of attempts: 1 Airway Equipment and Method: Bite block Placement Confirmation: positive ETCO2 Tube secured with: Tape Dental Injury: Teeth and Oropharynx as per pre-operative assessment

## 2017-06-05 NOTE — Discharge Instructions (Signed)
Diet: Clear liquids, advance to soft foods then regular diet as tolerated by this evening. ° °Pain control:  ° 1)  Ibuprofen 600 mg by mouth every 6-8 hours as needed for pain ° 2)  Ice pack/cold compress to operated eye(s) as desired ° °Eye medications:  Tobradex or Zylet eye ointment 1/2 inch in operated eye(s) twice a day if directed to do so by Dr. Young ° °Activity: No swimming for 1 week.  It is OK to let water run over the face and eyes while showering or taking a bath, even during the first week.  No other restriction on exercise or activity. ° ° °Call Dr. Young's office [phone] with any problems or concerns. ° ° ° ° °Post Anesthesia Home Care Instructions ° °Activity: °Get plenty of rest for the remainder of the day. A responsible individual must stay with you for 24 hours following the procedure.  °For the next 24 hours, DO NOT: °-Drive a car °-Operate machinery °-Drink alcoholic beverages °-Take any medication unless instructed by your physician °-Make any legal decisions or sign important papers. ° °Meals: °Start with liquid foods such as gelatin or soup. Progress to regular foods as tolerated. Avoid greasy, spicy, heavy foods. If nausea and/or vomiting occur, drink only clear liquids until the nausea and/or vomiting subsides. Call your physician if vomiting continues. ° °Special Instructions/Symptoms: °Your throat may feel dry or sore from the anesthesia or the breathing tube placed in your throat during surgery. If this causes discomfort, gargle with warm salt water. The discomfort should disappear within 24 hours. ° °If you had a scopolamine patch placed behind your ear for the management of post- operative nausea and/or vomiting: ° °1. The medication in the patch is effective for 72 hours, after which it should be removed.  Wrap patch in a tissue and discard in the trash. Wash hands thoroughly with soap and water. °2. You may remove the patch earlier than 72 hours if you experience unpleasant  side effects which may include dry mouth, dizziness or visual disturbances. °3. Avoid touching the patch. Wash your hands with soap and water after contact with the patch. °   ° °

## 2017-06-08 ENCOUNTER — Encounter (HOSPITAL_BASED_OUTPATIENT_CLINIC_OR_DEPARTMENT_OTHER): Payer: Self-pay | Admitting: Ophthalmology

## 2017-06-23 DIAGNOSIS — G4733 Obstructive sleep apnea (adult) (pediatric): Secondary | ICD-10-CM | POA: Diagnosis not present

## 2017-07-01 DIAGNOSIS — F341 Dysthymic disorder: Secondary | ICD-10-CM | POA: Diagnosis not present

## 2017-07-01 DIAGNOSIS — F5105 Insomnia due to other mental disorder: Secondary | ICD-10-CM | POA: Diagnosis not present

## 2017-07-01 DIAGNOSIS — F900 Attention-deficit hyperactivity disorder, predominantly inattentive type: Secondary | ICD-10-CM | POA: Diagnosis not present

## 2017-07-13 ENCOUNTER — Ambulatory Visit (INDEPENDENT_AMBULATORY_CARE_PROVIDER_SITE_OTHER): Payer: 59 | Admitting: Family Medicine

## 2017-07-13 ENCOUNTER — Encounter: Payer: Self-pay | Admitting: Family Medicine

## 2017-07-13 VITALS — BP 110/68 | HR 61 | Temp 98.6°F | Resp 16 | Wt 204.0 lb

## 2017-07-13 DIAGNOSIS — G4719 Other hypersomnia: Secondary | ICD-10-CM | POA: Diagnosis not present

## 2017-07-13 DIAGNOSIS — G4733 Obstructive sleep apnea (adult) (pediatric): Secondary | ICD-10-CM

## 2017-07-13 DIAGNOSIS — G4710 Hypersomnia, unspecified: Secondary | ICD-10-CM

## 2017-07-13 NOTE — Progress Notes (Signed)
       Patient: Matthew Romero Male    DOB: 08/17/1990   26 y.o.   MRN: [id number] Visit Date: 07/13/2017  Today's Provider: Lelon Huh, MD   Chief Complaint  Patient presents with  . Sleep Apnea   Subjective:    HPI  OSA (obstructive sleep apnea) From 03/26/2017- Has since started on Auto CPAP 4-20 with humidity which he is using every night. He states he falls asleep easier and sleeps solidly through the night, but still feels sleepy and fatigued when he gets up in the morning. He feels slightly more rested, but still not well rested. He states he feels like he is on the verge of falling asleep frequently throughout the day, but is not able to fall asleep without laying down.   No Known Allergies   Current Outpatient Medications:  .  albuterol (PROVENTIL HFA;VENTOLIN HFA) 108 (90 Base) MCG/ACT inhaler, Inhale 2 puffs into the lungs every 6 (six) hours as needed for wheezing or shortness of breath., Disp: , Rfl:  .  amphetamine-dextroamphetamine (ADDERALL XR) 20 MG 24 hr capsule, Take 40 mg by mouth daily., Disp: , Rfl:  .  amphetamine-dextroamphetamine (ADDERALL) 20 MG tablet, Take 20 mg by mouth daily., Disp: , Rfl:  .  FLUoxetine (PROZAC) 40 MG capsule, Take 80 mg by mouth daily., Disp: , Rfl:  .  traZODone (DESYREL) 150 MG tablet, Take 150 mg by mouth at bedtime., Disp: , Rfl:   Review of Systems  Constitutional: Positive for fatigue. Negative for appetite change, chills and fever.  Respiratory: Negative for chest tightness, shortness of breath and wheezing.   Cardiovascular: Negative for chest pain and palpitations.  Gastrointestinal: Negative for abdominal pain, nausea and vomiting.  Psychiatric/Behavioral: Negative for sleep disturbance.    Social History   Tobacco Use  . Smoking status: Current Some Day Smoker    Years: 6.00    Types: Cigars  . Smokeless tobacco: Never Used  . Tobacco comment: 1 cigar/month  Substance Use Topics  . Alcohol use: Yes   Comment: occasionally   Objective:   BP 110/68 (BP Location: Left Arm, Patient Position: Sitting, Cuff Size: Large)   Pulse 61   Temp 98.6 F (37 C) (Oral)   Resp 16   Wt 204 lb (92.5 kg)   SpO2 96% Comment: room air  BMI 25.50 kg/m  There were no vitals filed for this visit.   Physical Exam   General Appearance:    Alert, cooperative, no distress  Eyes:    PERRL, conjunctiva/corneas clear, EOM's intact       Lungs:     Clear to auscultation bilaterally, respirations unlabored  Heart:    Regular rate and rhythm  Neurologic:   Awake, alert, oriented x 3. No apparent focal neurological           defect.           Assessment & Plan:     1. OSA (obstructive sleep apnea) Slight subjective improvement since start Auto CPAP, but still with hypersomnia during the day. Repeat sleepy study on CPAP to see if apnea is controlled.  - Nocturnal polysomnography (NPSG); Future       Lelon Huh, MD  [facility]

## 2017-07-22 ENCOUNTER — Telehealth: Payer: Self-pay | Admitting: Family Medicine

## 2017-07-22 NOTE — Telephone Encounter (Signed)
Order for baseline PSG faxed to [facility]

## 2017-07-24 DIAGNOSIS — G4733 Obstructive sleep apnea (adult) (pediatric): Secondary | ICD-10-CM | POA: Diagnosis not present

## 2017-08-12 ENCOUNTER — Ambulatory Visit: Payer: 59 | Attending: Otolaryngology

## 2017-08-12 DIAGNOSIS — F5101 Primary insomnia: Secondary | ICD-10-CM | POA: Insufficient documentation

## 2017-08-12 DIAGNOSIS — G4733 Obstructive sleep apnea (adult) (pediatric): Secondary | ICD-10-CM | POA: Diagnosis not present

## 2017-08-31 ENCOUNTER — Telehealth: Payer: Self-pay | Admitting: Family Medicine

## 2017-08-31 NOTE — Telephone Encounter (Signed)
Please advise results? 

## 2017-08-31 NOTE — Telephone Encounter (Signed)
Please call [facility] about this at [phone], I still haven't gotten report about this.

## 2017-08-31 NOTE — Telephone Encounter (Signed)
Pt calling about the results of his sleep study. Pt would like a return call about sleep study. Thanks CC

## 2017-09-01 NOTE — Telephone Encounter (Signed)
Fax received and given to Dr. Caryn Section.

## 2017-09-01 NOTE — Telephone Encounter (Signed)
Report is being faxed.

## 2017-09-02 NOTE — Telephone Encounter (Signed)
Sleep study shows that apnea is completely controlled at 9cm pressure. Also has restless legs which is probably contributing to trouble sleeping. Recommend he start ropinirole for restless legs, 0.5mg  tablets 1/2 tablet hs for 4 days, then increase to 1 tablet hs for 4 days, then 2 tablets hs, #60, rf x 0.  Follow up 3-4 weeks.  I would recommend leaving CPAP on auto-titrate for the time being.

## 2017-09-02 NOTE — Telephone Encounter (Signed)
LMOVM for pt to return call 

## 2017-09-04 MED ORDER — ROPINIROLE HCL 0.5 MG PO TABS: ORAL_TABLET | ORAL | 0 refills | Status: DC

## 2017-09-04 NOTE — Telephone Encounter (Signed)
Patient was notified of results. Expressed understanding. Rx was sent to pharmacy. 

## 2017-09-04 NOTE — Telephone Encounter (Signed)
Pt returned call. Please advise. Thanks TNP °

## 2017-09-21 ENCOUNTER — Other Ambulatory Visit: Payer: Self-pay | Admitting: Family Medicine

## 2017-09-21 MED ORDER — OSELTAMIVIR PHOSPHATE 75 MG PO CAPS: 75.0000 mg | ORAL_CAPSULE | Freq: Every day | ORAL | 0 refills | Status: AC

## 2017-09-21 NOTE — Progress Notes (Signed)
Patients sibling positive for flu A. Sent prophylactic Tamiflu

## 2017-09-23 DIAGNOSIS — F5105 Insomnia due to other mental disorder: Secondary | ICD-10-CM | POA: Diagnosis not present

## 2017-09-23 DIAGNOSIS — F900 Attention-deficit hyperactivity disorder, predominantly inattentive type: Secondary | ICD-10-CM | POA: Diagnosis not present

## 2017-09-23 DIAGNOSIS — F341 Dysthymic disorder: Secondary | ICD-10-CM | POA: Diagnosis not present

## 2017-09-23 DIAGNOSIS — F332 Major depressive disorder, recurrent severe without psychotic features: Secondary | ICD-10-CM | POA: Diagnosis not present

## 2017-10-01 ENCOUNTER — Encounter: Payer: Self-pay | Admitting: Family Medicine

## 2017-10-01 ENCOUNTER — Ambulatory Visit (INDEPENDENT_AMBULATORY_CARE_PROVIDER_SITE_OTHER): Payer: 59 | Admitting: Family Medicine

## 2017-10-01 VITALS — BP 118/64 | HR 57 | Temp 98.0°F | Resp 16 | Wt 194.0 lb

## 2017-10-01 DIAGNOSIS — G2581 Restless legs syndrome: Secondary | ICD-10-CM

## 2017-10-01 DIAGNOSIS — G4733 Obstructive sleep apnea (adult) (pediatric): Secondary | ICD-10-CM | POA: Diagnosis not present

## 2017-10-01 DIAGNOSIS — Z23 Encounter for immunization: Secondary | ICD-10-CM

## 2017-10-01 MED ORDER — ROPINIROLE HCL 2 MG PO TABS: 2.0000 mg | ORAL_TABLET | Freq: Every day | ORAL | 1 refills | Status: DC

## 2017-10-01 NOTE — Patient Instructions (Addendum)
   Increase ropinirole to 1.5mg  every night  (3 x 0.5mg  tablets) for the next week, or until current bottle is empty, whichever comes first   Then start taking a ropinirole 2mg  tablet once every night   If not much better within a month, call for prescription for 3mg  tablets.

## 2017-10-01 NOTE — Progress Notes (Signed)
Patient: Matthew Romero Male    DOB: [birth year]   26 y.o.   MRN: [id number] Visit Date: 10/01/2017  Today's Provider: Lelon Huh, MD   Chief Complaint  Patient presents with  . Follow-up   Subjective:    HPI  OSA (obstructive sleep apnea) From 07/13/2017-Repeat sleepy study ordered, showing that apnea is completely controlled at 9cm pressure and.recommended leaving CPAP on auto-titrate for the time being.He is using CPAP every night. He states his mood is good, not feeling anxious or depressed. He continues to work second shift at [employer] which he feels is going well.   RLS From 07/13/2017-Also has restless legs which is probably contributing to trouble sleeping. Recommended he started on ropinirole for restless legs at 1/2 x 0.5mg  qhs an has titrated up to 2 x 0.5mg , however he has not seen an improvement with restlessness of legs or tiredness.      No Known Allergies   Current Outpatient Medications:  .  albuterol (PROVENTIL HFA;VENTOLIN HFA) 108 (90 Base) MCG/ACT inhaler, Inhale 2 puffs into the lungs every 6 (six) hours as needed for wheezing or shortness of breath., Disp: , Rfl:  .  amphetamine-dextroamphetamine (ADDERALL XR) 20 MG 24 hr capsule, Take 40 mg by mouth daily., Disp: , Rfl:  .  amphetamine-dextroamphetamine (ADDERALL) 20 MG tablet, Take 20 mg by mouth daily., Disp: , Rfl:  .  FLUoxetine (PROZAC) 40 MG capsule, Take 80 mg by mouth daily., Disp: , Rfl:  .  oseltamivir (TAMIFLU) 75 MG capsule, Take 1 capsule (75 mg total) by mouth daily for 10 days., Disp: 10 capsule, Rfl: 0 .  rOPINIRole (REQUIP) 0.5 MG tablet, Take 1/2 tablet nightly for 4 nights, then increase to 1 tablet nightly for 4 nights, then 2 tablets nightly, Disp: 60 tablet, Rfl: 0 .  traZODone (DESYREL) 150 MG tablet, Take 150 mg by mouth at bedtime., Disp: , Rfl:   Review of Systems  Constitutional: Negative for appetite change, chills and fever.  Respiratory: Negative for chest  tightness, shortness of breath and wheezing.   Cardiovascular: Negative for chest pain and palpitations.  Gastrointestinal: Negative for abdominal pain, nausea and vomiting.    Social History   Tobacco Use  . Smoking status: Current Some Day Smoker    Years: 6.00    Types: Cigars  . Smokeless tobacco: Never Used  . Tobacco comment: 1 cigar/month  Substance Use Topics  . Alcohol use: Yes    Comment: occasionally   Objective:   BP 118/64 (BP Location: Right Arm, Patient Position: Sitting, Cuff Size: Large)   Pulse (!) 57   Temp 98 F (36.7 C) (Oral)   Resp 16   Wt 194 lb (88 kg)   SpO2 98%   BMI 24.25 kg/m  Vitals:   10/01/17 1101  BP: 118/64  Pulse: (!) 57  Resp: 16  Temp: 98 F (36.7 C)  TempSrc: Oral  SpO2: 98%  Weight: 194 lb (88 kg)     Physical Exam  General appearance: alert, well developed, well nourished, cooperative and in no distress Head: Normocephalic, without obvious abnormality, atraumatic Respiratory: Respirations even and unlabored, normal respiratory rate Extremities: No gross deformities Skin: Skin color, texture, turgor normal. No rashes seen  Psych: Appropriate mood and affect. Neurologic: Mental status: Alert, oriented to person, place, and time, thought content appropriate.     Assessment & Plan:     1. Restless leg syndrome Is tolerating 1mg  a day of ropinirole,  but no symptoms relief. Will go up to 1.5mg  until current bottle is exhausted, then increase to - rOPINIRole (REQUIP) 2 MG tablet; Take 1 tablet (2 mg total) by mouth at bedtime.  Dispense: 30 tablet; Refill: 1  If not significantly improved within a month, then will increase to 3mg  tablets.   2. Need for Td vaccine  - Td : Tetanus/diphtheria >7yo Preservative  free  3. OSA (obstructive sleep apnea) Controlled on current CPAP settings.        Lelon Huh, MD  [facility]

## 2017-12-15 DIAGNOSIS — F341 Dysthymic disorder: Secondary | ICD-10-CM | POA: Diagnosis not present

## 2017-12-15 DIAGNOSIS — F900 Attention-deficit hyperactivity disorder, predominantly inattentive type: Secondary | ICD-10-CM | POA: Diagnosis not present

## 2017-12-15 DIAGNOSIS — F5105 Insomnia due to other mental disorder: Secondary | ICD-10-CM | POA: Diagnosis not present

## 2018-02-16 DIAGNOSIS — F332 Major depressive disorder, recurrent severe without psychotic features: Secondary | ICD-10-CM | POA: Diagnosis not present

## 2018-02-16 DIAGNOSIS — F5105 Insomnia due to other mental disorder: Secondary | ICD-10-CM | POA: Diagnosis not present

## 2018-02-16 DIAGNOSIS — F900 Attention-deficit hyperactivity disorder, predominantly inattentive type: Secondary | ICD-10-CM | POA: Diagnosis not present

## 2018-04-16 IMAGING — MR MR HEAD WO/W CM
9 of 12 series · 33 of 48 positions shown · IV contrast (multihance)
Comparison: None.

CLINICAL DATA: Trouble concentrating. Light sensitivity.
Depression.

EXAM:
MRI HEAD WITHOUT AND WITH CONTRAST
TECHNIQUE: Multiplanar, multiecho pulse sequences of the brain and surrounding
structures were obtained without and with intravenous contrast.
CONTRAST:  15mL MULTIHANCE GADOBENATE DIMEGLUMINE 529 MG/ML IV SOLN

[Series 2: T1 · sagittal · 5.0mm · 0.45mm/px · 3 of 29 slices shown]
[im 1/29]
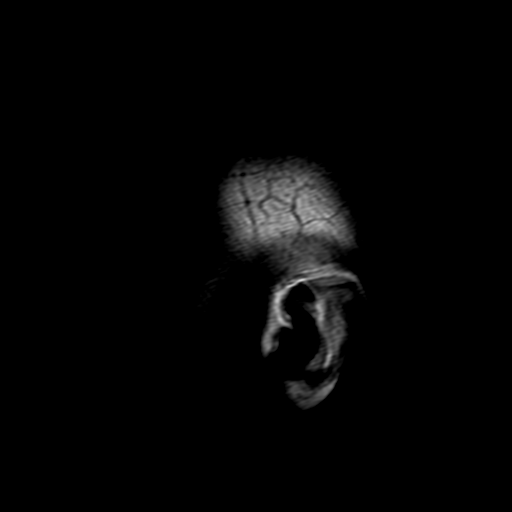
[im 15/29]
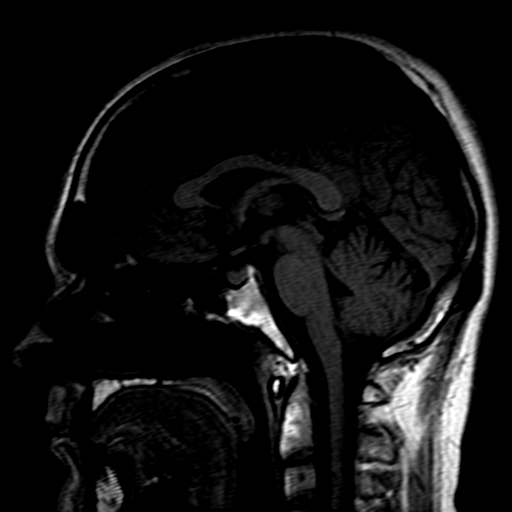
[im 29/29]
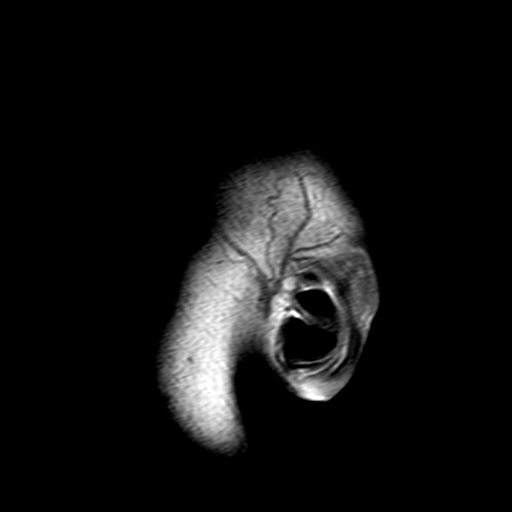

[Series 4: DWI · axial · 4.0mm · 0.94mm/px · z∈[-52,+120]mm · 5 of 44 slices shown (1 of 2)]
[im 1/44]
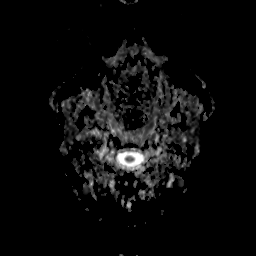
[im 11/44]
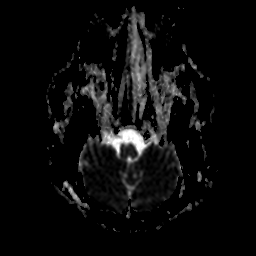
[im 22/44]
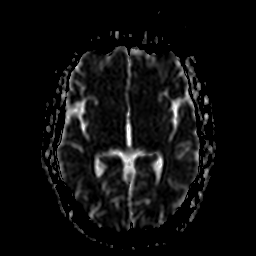
[im 33/44]
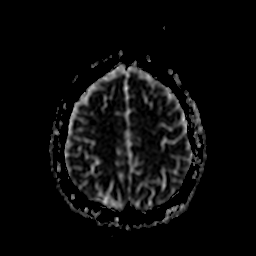
[im 44/44]
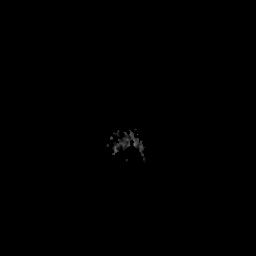

[Series 6: DWI · coronal · 5.0mm · 1.80mm/px · 4 of 39 slices shown (2 of 2)]
[im 1/39]
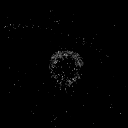
[im 13/39]
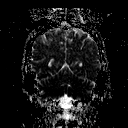
[im 26/39]
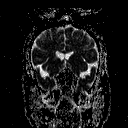
[im 39/39]
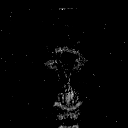

[Series 9: T2 · axial · 5.0mm · 0.45mm/px · z∈[-49,+120]mm · 3 of 27 slices shown (1 of 2)]
[im 1/27]
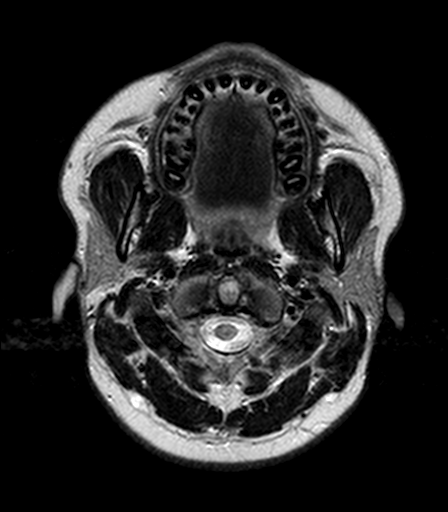
[im 14/27]
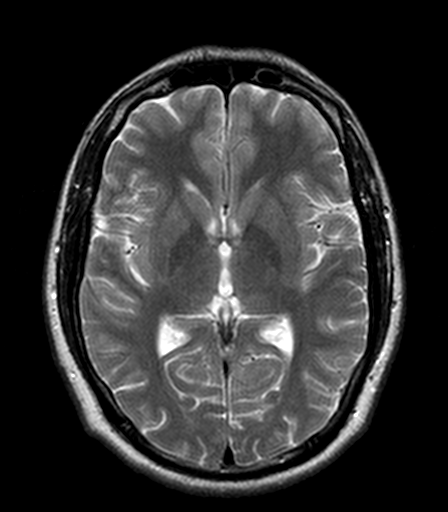
[im 27/27]
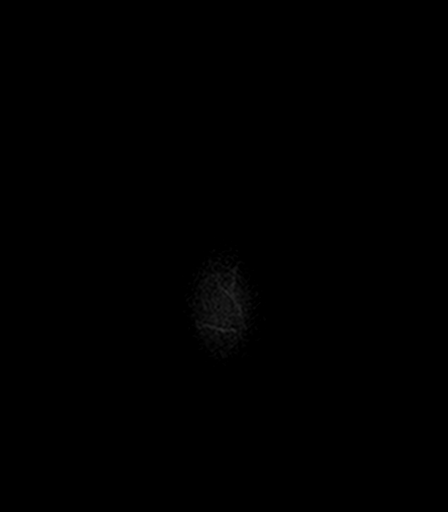

[Series 10: FLAIR · axial · 5.0mm · 0.72mm/px · z∈[-49,+120]mm · 3 of 27 slices shown]
[im 1/27]
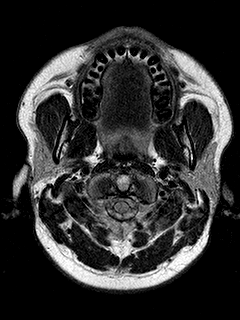
[im 14/27]
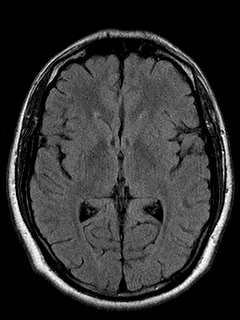
[im 27/27]
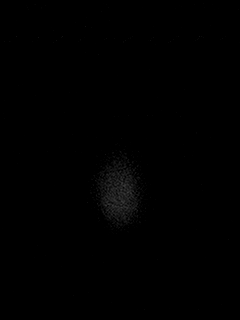

[Series 11: T2 · axial · 5.0mm · 0.45mm/px · z∈[-49,+120]mm · 3 of 27 slices shown (2 of 2)]
[im 1/27]
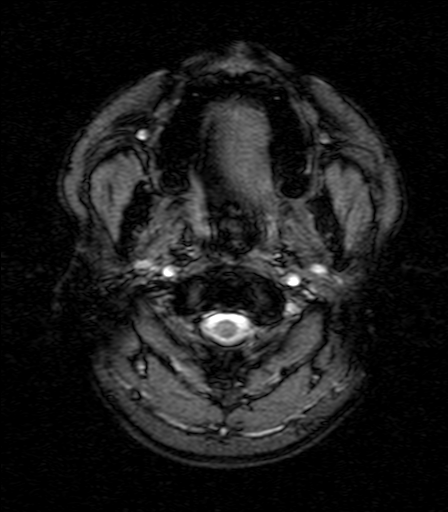
[im 14/27]
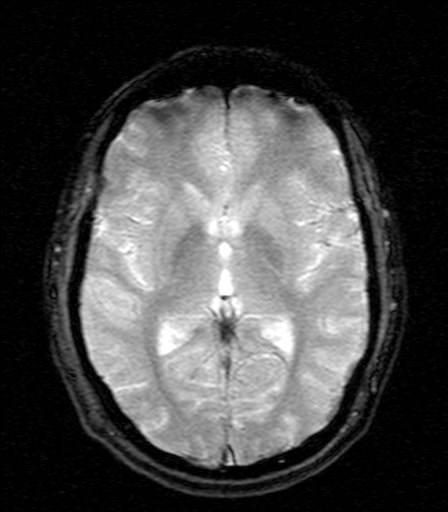
[im 27/27]
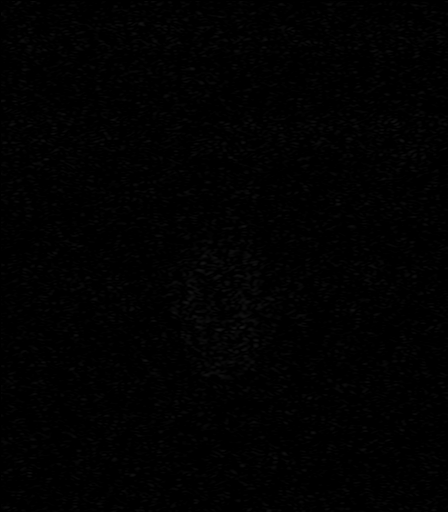

[Series 13: T2 post-contrast · coronal · 5.0mm · 0.72mm/px · 3 of 31 slices shown]
[im 1/31]
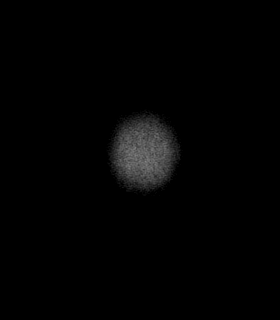
[im 16/31]
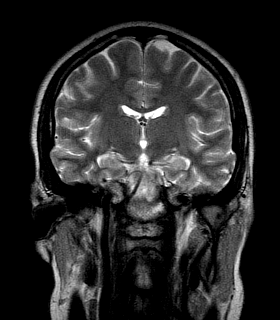
[im 31/31]
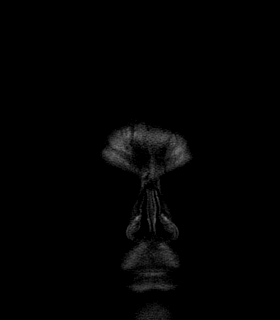

[Series 14: T1 post-contrast · axial · 3.0mm · 0.45mm/px · z∈[-53,+124]mm · 6 of 60 slices shown (1 of 2)]
[im 1/60]
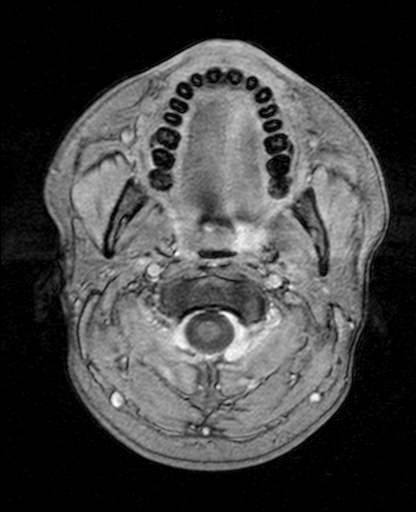
[im 12/60]
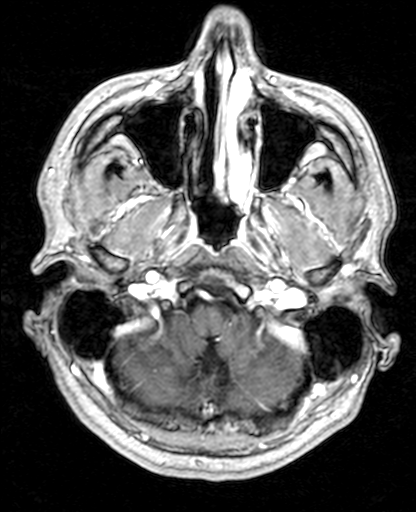
[im 24/60]
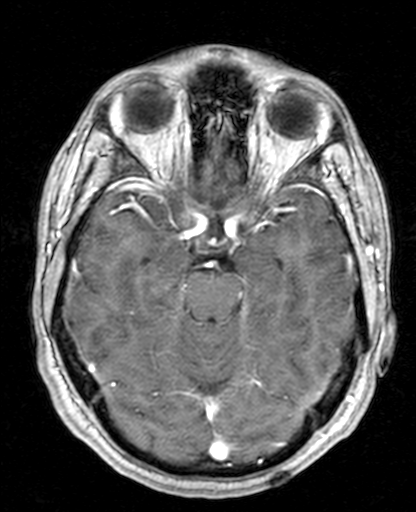
[im 36/60]
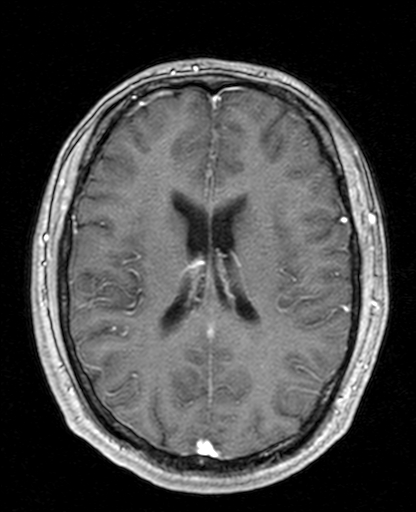
[im 48/60]
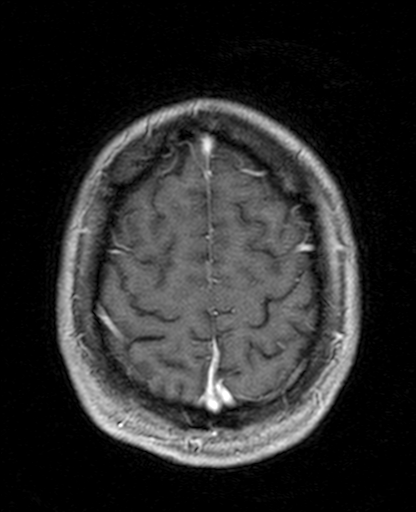
[im 60/60]
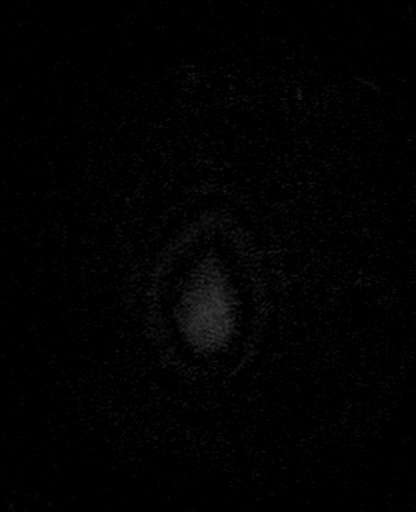

[Series 15: T1 post-contrast · coronal · 5.0mm · 0.45mm/px · 3 of 31 slices shown (2 of 2)]
[im 1/31]
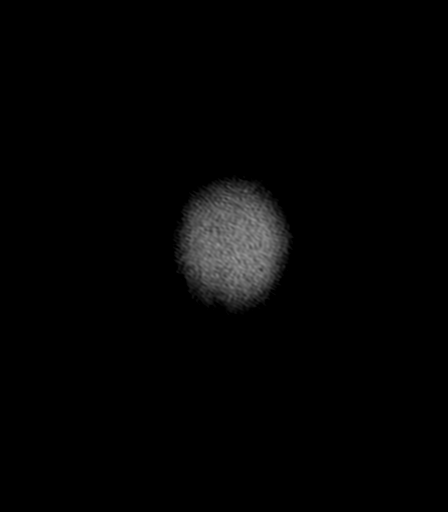
[im 16/31]
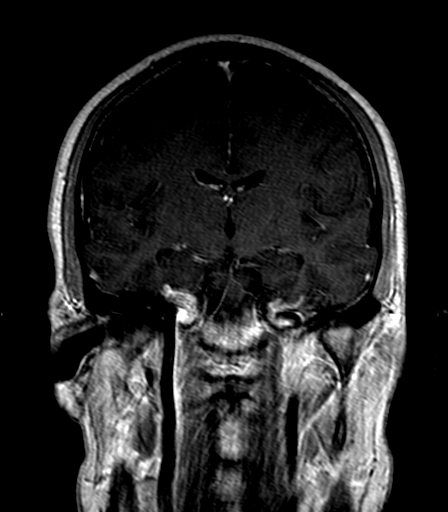
[im 31/31]
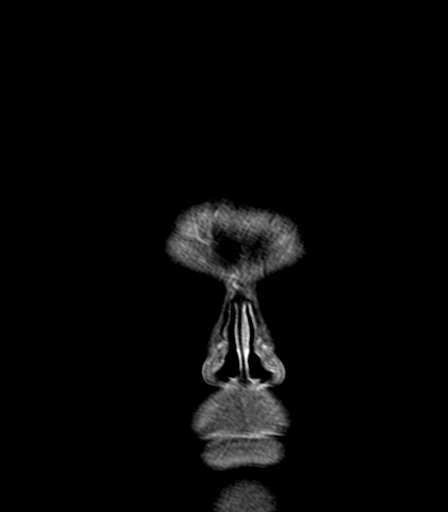

[33 of 48 positions shown; findings below may reference images not displayed]

FINDINGS: Brain: No evidence for acute infarction, hemorrhage, mass lesion,
hydrocephalus, or extra-axial fluid. Normal cerebral volume. No
white matter disease.

Post infusion, no abnormal enhancement of the brain or meninges.

Vascular: Flow voids are maintained throughout the carotid, basilar,
and vertebral arteries. There are no areas of chronic hemorrhage.
Major dural venous sinuses appear patent.

Skull and upper cervical spine: Unremarkable visualized calvarium,
skullbase, and cervical vertebrae. Pituitary, pineal, cerebellar
tonsils unremarkable. No upper cervical cord lesions.

Sinuses/Orbits: No layering fluid.  Unremarkable orbits.

Other: None.

## 2018-05-13 DIAGNOSIS — F900 Attention-deficit hyperactivity disorder, predominantly inattentive type: Secondary | ICD-10-CM | POA: Diagnosis not present

## 2018-05-13 DIAGNOSIS — F5105 Insomnia due to other mental disorder: Secondary | ICD-10-CM | POA: Diagnosis not present

## 2018-05-13 DIAGNOSIS — F341 Dysthymic disorder: Secondary | ICD-10-CM | POA: Diagnosis not present

## 2018-06-18 IMAGING — CR DG CHEST 2V
1 series · 2 of 2 positions shown · non-contrast
Comparison: October 15, 2006

CLINICAL DATA: Exercise induced asthma

EXAM:
CHEST  2 VIEW

[Series 1: dg chest 2 view · 0.14mm/px · 2 of 2 slices shown]
[im 1/2]
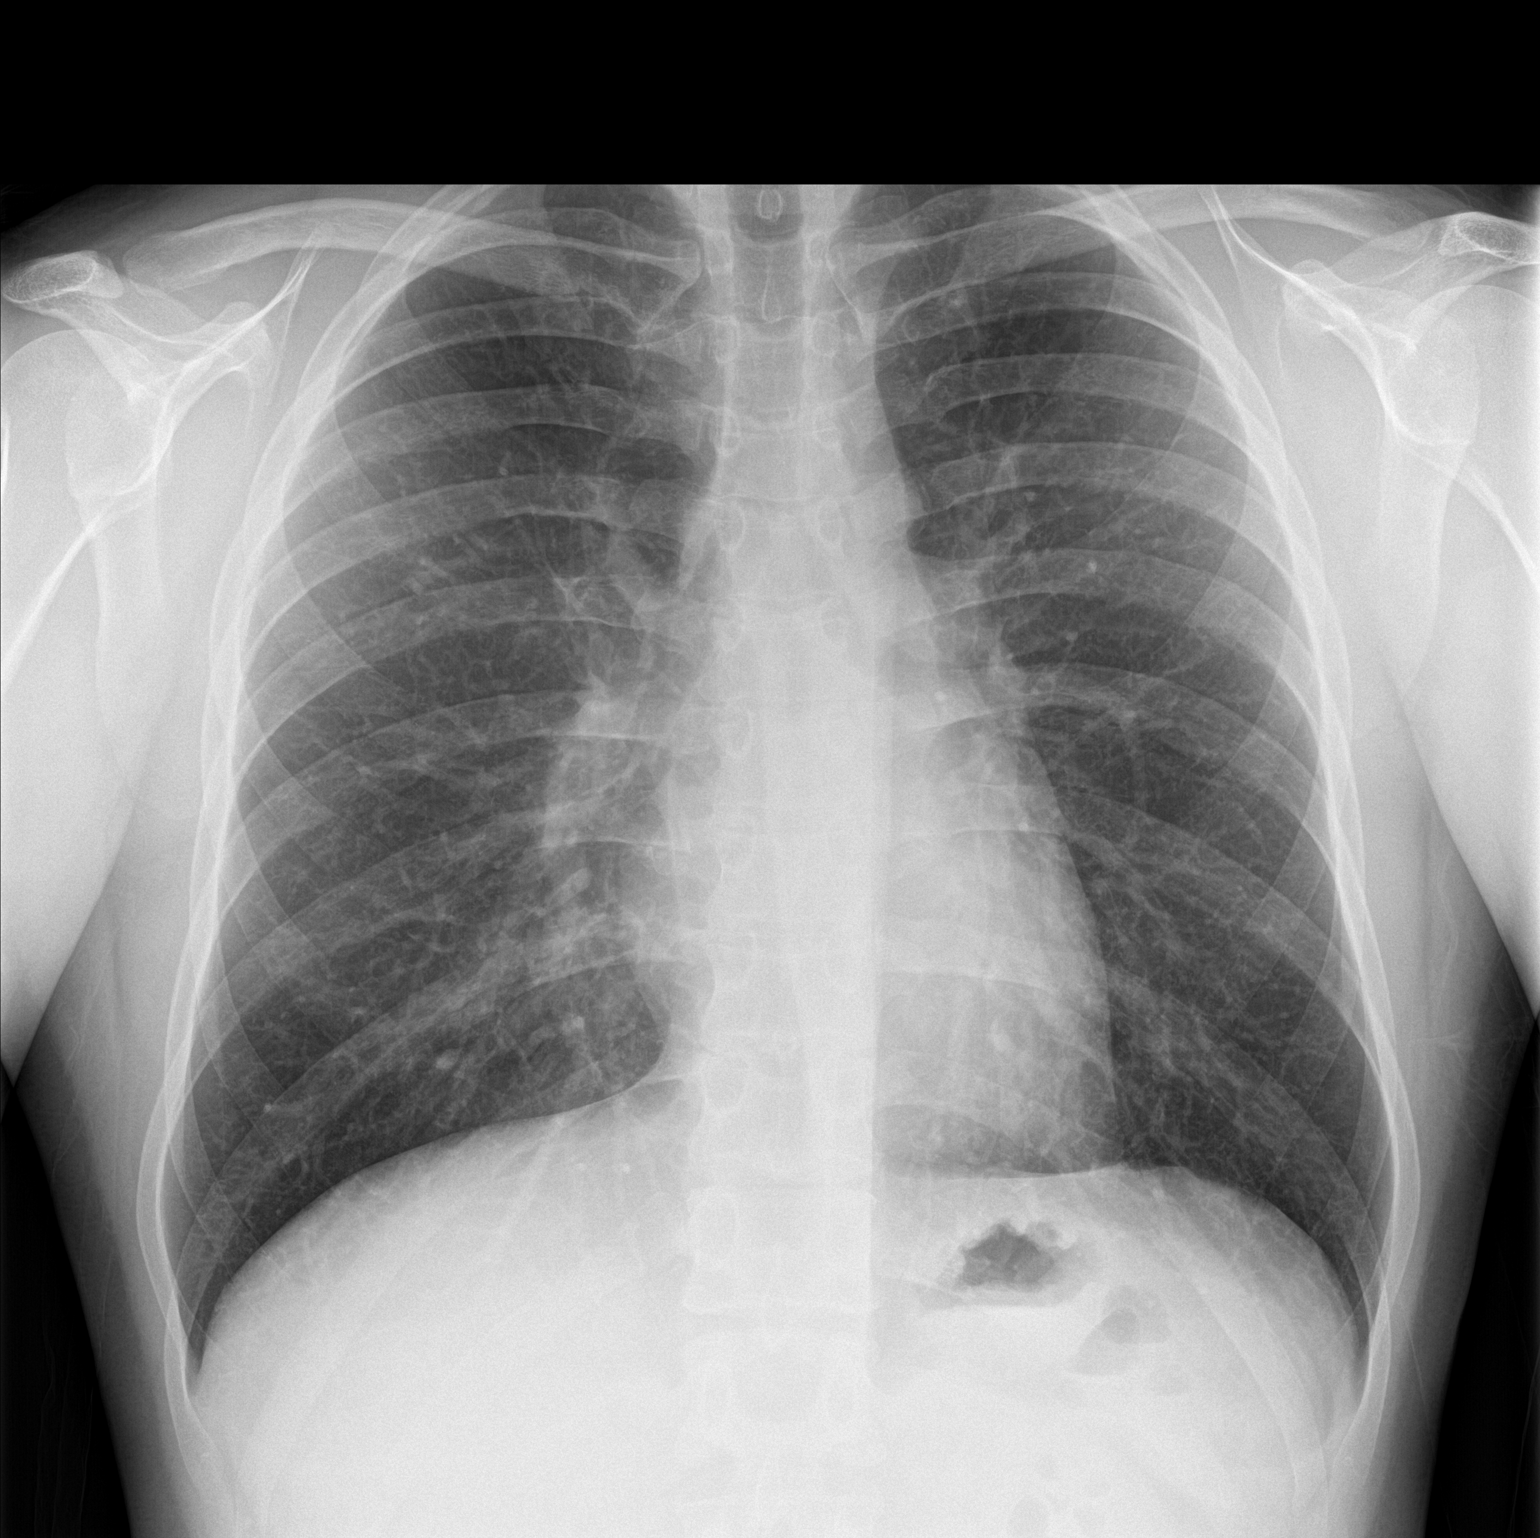
[im 2/2]
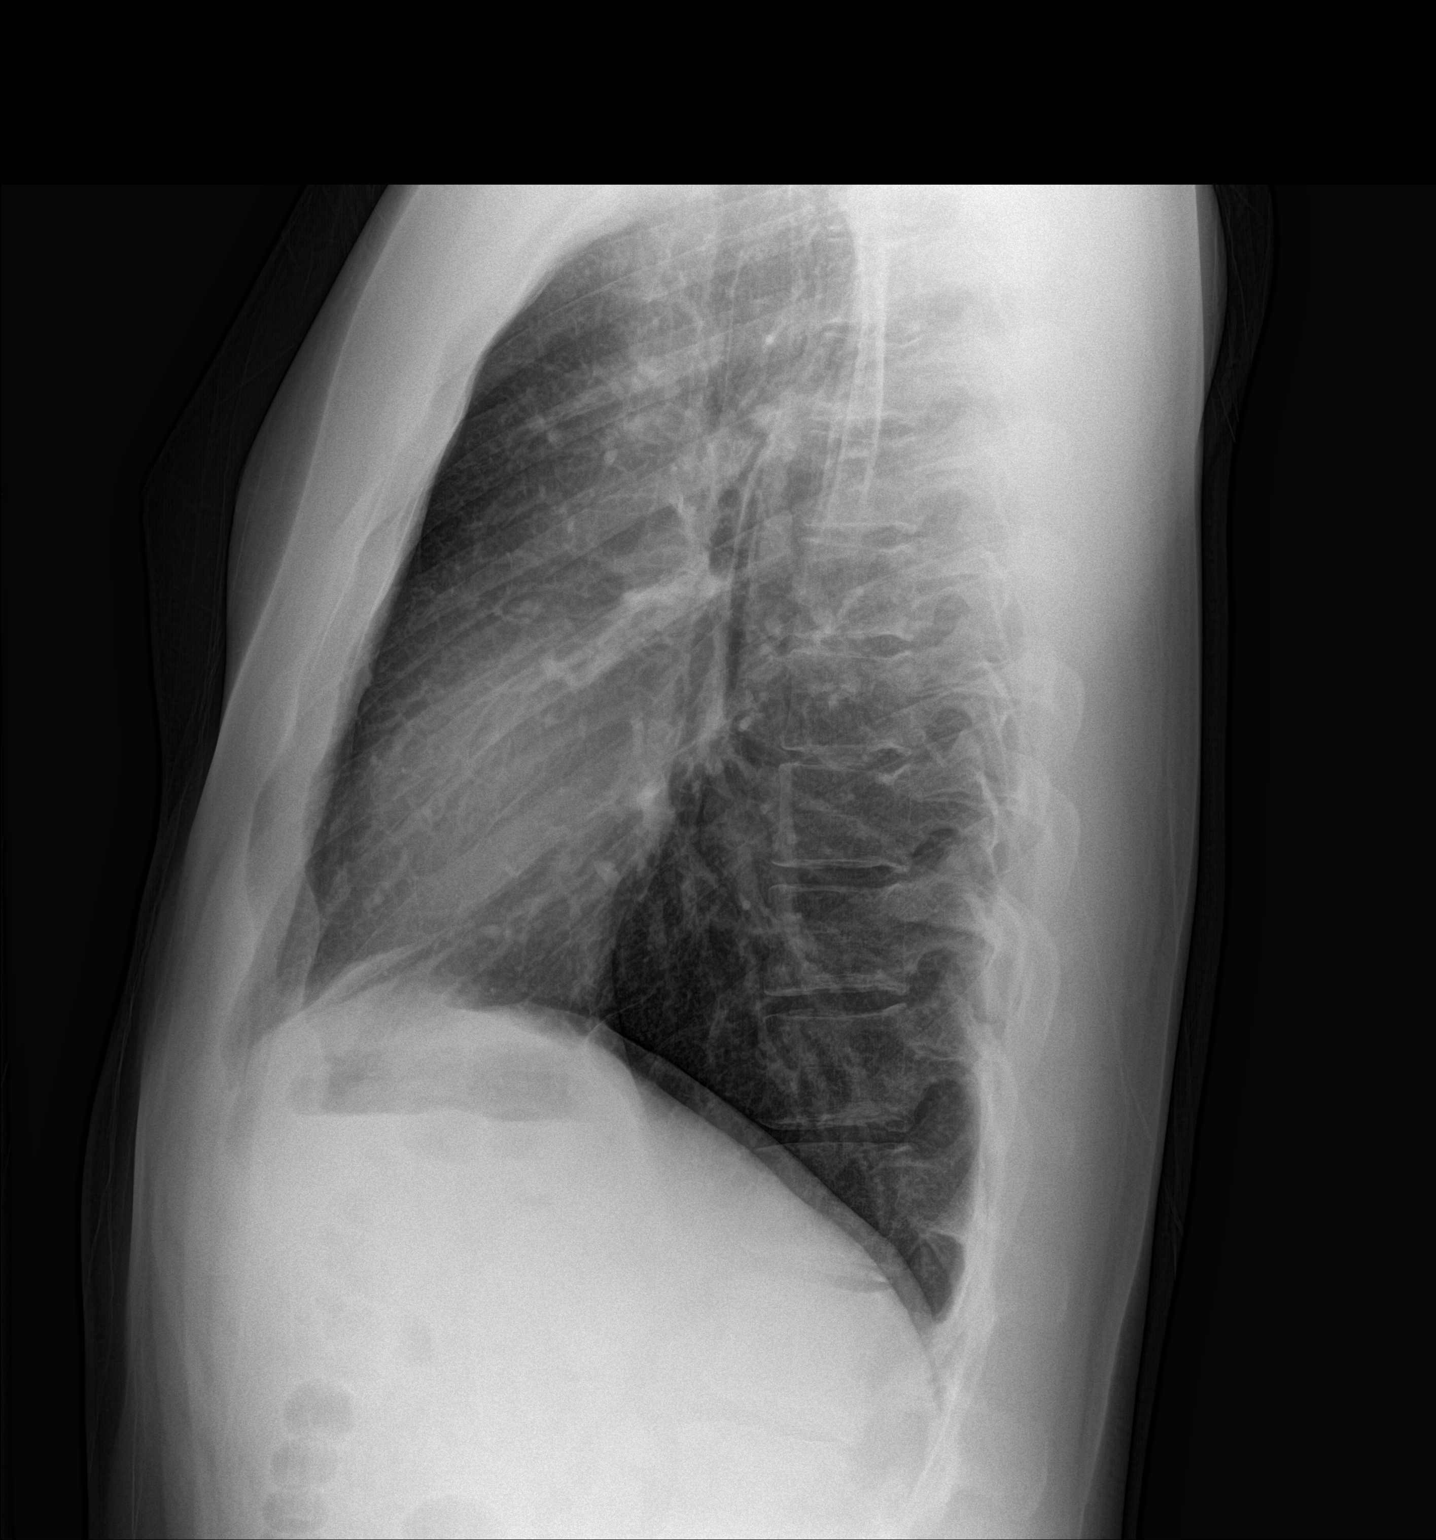

[2 of 2 positions shown; findings below may reference images not displayed]

FINDINGS: The heart size and mediastinal contours are within normal limits.
Both lungs are clear. The visualized skeletal structures are
unremarkable.
IMPRESSION: No active cardiopulmonary disease.

## 2018-07-20 ENCOUNTER — Encounter: Payer: Self-pay | Admitting: Family Medicine

## 2018-07-20 ENCOUNTER — Ambulatory Visit (INDEPENDENT_AMBULATORY_CARE_PROVIDER_SITE_OTHER): Payer: 59 | Admitting: Family Medicine

## 2018-07-20 VITALS — BP 103/65 | HR 72 | Temp 98.7°F | Resp 16 | Wt 196.0 lb

## 2018-07-20 DIAGNOSIS — B349 Viral infection, unspecified: Secondary | ICD-10-CM | POA: Diagnosis not present

## 2018-07-20 NOTE — Progress Notes (Signed)
       Patient: Matthew Romero Male    DOB: Nov 04, 1990   28 y.o.   MRN: [id number] Visit Date: 07/20/2018  Today's Provider: Lelon Huh, MD   Chief Complaint  Patient presents with  . URI   Subjective:     HPI Resolved Flu Like symptoms: Patient comes in today reporting that he had flu like symptoms last week, which are now all resolved. Symptoms included chills, body aches, nausea and fever up to 102. He reports that the symptoms started Tuesday of last week (07/13/2018) and lasted until Friday (07/16/2018). Patient missed work due this illness, and now needs a note excusing him from work on these days.  No Known Allergies   Current Outpatient Medications:  .  albuterol (PROVENTIL HFA;VENTOLIN HFA) 108 (90 Base) MCG/ACT inhaler, Inhale 2 puffs into the lungs every 6 (six) hours as needed for wheezing or shortness of breath., Disp: , Rfl:  .  amphetamine-dextroamphetamine (ADDERALL XR) 20 MG 24 hr capsule, Take 40 mg by mouth daily., Disp: , Rfl:  .  amphetamine-dextroamphetamine (ADDERALL) 20 MG tablet, Take 20 mg by mouth daily., Disp: , Rfl:  .  FLUoxetine (PROZAC) 40 MG capsule, Take 80 mg by mouth daily., Disp: , Rfl:  .  rOPINIRole (REQUIP) 2 MG tablet, Take 1 tablet (2 mg total) by mouth at bedtime., Disp: 30 tablet, Rfl: 1 .  traZODone (DESYREL) 150 MG tablet, Take 150 mg by mouth at bedtime., Disp: , Rfl:   Review of Systems  Constitutional: Positive for chills (resolved), fatigue (resolved) and fever (up to 102- resolved). Negative for appetite change.  Respiratory: Negative for chest tightness, shortness of breath and wheezing.   Cardiovascular: Negative for chest pain and palpitations.  Gastrointestinal: Positive for nausea (resolved). Negative for abdominal pain and vomiting.  Musculoskeletal: Positive for myalgias (body aches-resolved).    Social History   Tobacco Use  . Smoking status: Current Some Day Smoker    Years: 6.00    Types: Cigars  .  Smokeless tobacco: Never Used  . Tobacco comment: 1 cigar/month  Substance Use Topics  . Alcohol use: Yes    Comment: occasionally      Objective:   BP 103/65 (BP Location: Right Arm, Patient Position: Sitting, Cuff Size: Large)   Pulse 72   Temp 98.7 F (37.1 C) (Oral)   Resp 16   Wt 196 lb (88.9 kg)   SpO2 97% Comment: room air  BMI 24.50 kg/m  Vitals:   07/20/18 1555  BP: 103/65  Pulse: 72  Resp: 16  Temp: 98.7 F (37.1 C)  TempSrc: Oral  SpO2: 97%  Weight: 196 lb (88.9 kg)     Physical Exam  General Appearance:    Alert, cooperative, no distress  HENT:   ENT exam normal, no neck nodes or sinus tenderness  Eyes:    PERRL, conjunctiva/corneas clear, EOM's intact       Lungs:     Clear to auscultation bilaterally, respirations unlabored  Heart:    Regular rate and rhythm  Neurologic:   Awake, alert, oriented x 3. No apparent focal neurological           defect.          Assessment & Plan    1. Viral syndrome Now resolved. Work excuse for 07/13/2018 through 07/18/2018 and returned to work on 07/19/2018     Lelon Huh, MD  [facility]

## 2018-07-20 NOTE — Patient Instructions (Signed)
.   Please bring all of your medications to every appointment so we can make sure that our medication list is the same as yours.   

## 2019-07-05 ENCOUNTER — Other Ambulatory Visit: Payer: 59

## 2019-07-13 ENCOUNTER — Ambulatory Visit: Payer: 59 | Attending: Internal Medicine

## 2019-07-13 DIAGNOSIS — Z20822 Contact with and (suspected) exposure to covid-19: Secondary | ICD-10-CM

## 2019-07-14 LAB — NOVEL CORONAVIRUS, NAA: SARS-CoV-2, NAA: DETECTED — AB

## 2019-07-22 ENCOUNTER — Encounter: Payer: Self-pay | Admitting: Family Medicine

## 2019-07-22 ENCOUNTER — Telehealth (INDEPENDENT_AMBULATORY_CARE_PROVIDER_SITE_OTHER): Payer: 59 | Admitting: Family Medicine

## 2019-07-22 VITALS — Temp 97.6°F | Ht 75.0 in | Wt 185.0 lb

## 2019-07-22 DIAGNOSIS — G9331 Postviral fatigue syndrome: Secondary | ICD-10-CM

## 2019-07-22 DIAGNOSIS — M791 Myalgia, unspecified site: Secondary | ICD-10-CM | POA: Diagnosis not present

## 2019-07-22 DIAGNOSIS — U071 COVID-19: Secondary | ICD-10-CM | POA: Diagnosis not present

## 2019-07-22 DIAGNOSIS — G933 Postviral fatigue syndrome: Secondary | ICD-10-CM | POA: Diagnosis not present

## 2019-07-22 MED ORDER — PREDNISONE 20 MG PO TABS: 20.0000 mg | ORAL_TABLET | Freq: Two times a day (BID) | ORAL | 0 refills | Status: AC

## 2019-07-22 NOTE — Progress Notes (Signed)
Patient: Matthew Romero Male    DOB: May 25, 1991   29 y.o.   MRN: [id number] Visit Date: 07/22/2019  Today's Provider: Lelon Huh, MD   Chief Complaint  Patient presents with  . Sinusitis  . Fever   Subjective:    Virtual Visit via Video Note  I connected with Matthew Romero on 07/22/19 at  3:00 PM EST by a video enabled telemedicine application and verified that I am speaking with the correct person using two identifiers.  Location: Patient: home Provider: [facility]     Fever  The current episode started 1 to 4 weeks ago. The problem occurs intermittently. The problem has been resolved. Associated symptoms include headaches. Pertinent negatives include no congestion, coughing, ear pain or sore throat.  Symptoms started about January 11th or 12th and had positive Covid test on January 13th. Had mild cough at first, but not even enough to require use of his inhaler. However he has been extremely fatigued and having occasional chills, but no sweats or fevers. No chest pains, abdominal pains, cramping, nausea, vomiting, or diarrhea. Is also have generalized muscle aching and tightness with pains in his hands, arms, and legs with activity.   No Known Allergies   Current Outpatient Medications:  .  albuterol (PROVENTIL HFA;VENTOLIN HFA) 108 (90 Base) MCG/ACT inhaler, Inhale 2 puffs into the lungs every 6 (six) hours as needed for wheezing or shortness of breath., Disp: , Rfl:  .  amphetamine-dextroamphetamine (ADDERALL XR) 20 MG 24 hr capsule, Take 40 mg by mouth daily., Disp: , Rfl:  .  amphetamine-dextroamphetamine (ADDERALL) 20 MG tablet, Take 20 mg by mouth daily., Disp: , Rfl:  .  FLUoxetine (PROZAC) 40 MG capsule, Take 80 mg by mouth daily., Disp: , Rfl:  .  rOPINIRole (REQUIP) 2 MG tablet, Take 1 tablet (2 mg total) by mouth at bedtime. (Patient not taking: Reported on 07/22/2019), Disp: 30 tablet, Rfl: 1 .  traZODone (DESYREL) 150 MG tablet, Take 150 mg by  mouth at bedtime., Disp: , Rfl:   Review of Systems  Constitutional: Positive for fever.  HENT: Positive for rhinorrhea, sinus pressure and sinus pain. Negative for congestion, ear pain and sore throat.   Eyes: Negative.   Respiratory: Negative for cough, chest tightness and shortness of breath.   Cardiovascular: Negative.   Gastrointestinal: Negative.   Endocrine: Negative.   Genitourinary: Negative.   Musculoskeletal: Negative.   Allergic/Immunologic: Negative.   Neurological: Positive for headaches.  Hematological: Negative.   Psychiatric/Behavioral: Negative.     Social History   Tobacco Use  . Smoking status: Current Some Day Smoker    Years: 6.00    Types: Cigars  . Smokeless tobacco: Never Used  . Tobacco comment: 1 cigar/month  Substance Use Topics  . Alcohol use: Yes    Comment: occasionally      Objective:   Temp 97.6 F (36.4 C) (Temporal)   Ht 6\' 3"  (1.905 m)   Wt 185 lb (83.9 kg)   SpO2 99%   BMI 23.12 kg/m  Vitals:   07/22/19 1458  Temp: 97.6 F (36.4 C)  TempSrc: Temporal  SpO2: 99%  Weight: 185 lb (83.9 kg)  Height: 6\' 3"  (1.905 m)  Body mass index is 23.12 kg/m.   Physical Exam  Awake, alert, oriented x 3. In no apparent distress, but appears very fatigued.   No results found for any visits on 07/22/19.     Assessment & Plan  1. COVID-19 Is now about 10 days out from onset of symptoms and slowly improving. However is much to fatigued with symptoms of MS inflammation to return to work. Will get on short course of prednisone. Anticipated returning to work on 07/26/2019 which will be 14 days s/p positive Covid test. He can call for RTW note or to let me know if not steadily improving over the weekend.  - predniSONE (DELTASONE) 20 MG tablet; Take 1 tablet (20 mg total) by mouth 2 (two) times daily with a meal for 5 days.  Dispense: 10 tablet; Refill: 0  2. Myalgia  - predniSONE (DELTASONE) 20 MG tablet; Take 1 tablet (20 mg total) by  mouth 2 (two) times daily with a meal for 5 days.  Dispense: 10 tablet; Refill: 0  3. Postviral fatigue syndrome  - predniSONE (DELTASONE) 20 MG tablet; Take 1 tablet (20 mg total) by mouth 2 (two) times daily with a meal for 5 days.  Dispense: 10 tablet; Refill: 0    I discussed the assessment and treatment plan with the patient. The patient was provided an opportunity to ask questions and all were answered. The patient agreed with the plan and demonstrated an understanding of the instructions.   The patient was advised to call back or seek an in-person evaluation if the symptoms worsen or if the condition fails to improve as anticipated.  I provided 12 minutes of non-face-to-face time during this encounter.  The entirety of the information documented in the History of Present Illness, Review of Systems and Physical Exam were personally obtained by me. Portions of this information were initially documented by [facility], CMA and reviewed by me for thoroughness and accuracy.      Lelon Huh, MD  [facility]

## 2019-07-26 ENCOUNTER — Ambulatory Visit: Payer: 59 | Attending: Internal Medicine

## 2019-07-26 DIAGNOSIS — Z20822 Contact with and (suspected) exposure to covid-19: Secondary | ICD-10-CM

## 2019-07-27 LAB — NOVEL CORONAVIRUS, NAA: SARS-CoV-2, NAA: NOT DETECTED

## 2020-02-08 ENCOUNTER — Other Ambulatory Visit: Payer: Self-pay

## 2020-02-08 ENCOUNTER — Ambulatory Visit: Payer: 59 | Admitting: Dermatology

## 2020-02-08 ENCOUNTER — Other Ambulatory Visit: Payer: Self-pay | Admitting: Dermatology

## 2020-02-08 DIAGNOSIS — D2371 Other benign neoplasm of skin of right lower limb, including hip: Secondary | ICD-10-CM | POA: Diagnosis not present

## 2020-02-08 DIAGNOSIS — D2361 Other benign neoplasm of skin of right upper limb, including shoulder: Secondary | ICD-10-CM

## 2020-02-08 DIAGNOSIS — D1800 Hemangioma unspecified site: Secondary | ICD-10-CM

## 2020-02-08 DIAGNOSIS — L578 Other skin changes due to chronic exposure to nonionizing radiation: Secondary | ICD-10-CM

## 2020-02-08 DIAGNOSIS — D485 Neoplasm of uncertain behavior of skin: Secondary | ICD-10-CM

## 2020-02-08 DIAGNOSIS — L821 Other seborrheic keratosis: Secondary | ICD-10-CM

## 2020-02-08 DIAGNOSIS — D225 Melanocytic nevi of trunk: Secondary | ICD-10-CM

## 2020-02-08 DIAGNOSIS — D229 Melanocytic nevi, unspecified: Secondary | ICD-10-CM | POA: Diagnosis not present

## 2020-02-08 DIAGNOSIS — D239 Other benign neoplasm of skin, unspecified: Secondary | ICD-10-CM

## 2020-02-08 DIAGNOSIS — Z1283 Encounter for screening for malignant neoplasm of skin: Secondary | ICD-10-CM

## 2020-02-08 DIAGNOSIS — L814 Other melanin hyperpigmentation: Secondary | ICD-10-CM

## 2020-02-08 HISTORY — DX: Other benign neoplasm of skin, unspecified: D23.9

## 2020-02-08 NOTE — Patient Instructions (Signed)

## 2020-02-08 NOTE — Progress Notes (Signed)
   New Patient Visit  Subjective  Matthew Romero is a 29 y.o. male who presents for the following: Annual Exam (TBSE today). The patient presents for Total-Body Skin Exam (TBSE) for skin cancer screening and mole check.  The following portions of the chart were reviewed this encounter and updated as appropriate:  Tobacco  Allergies  Meds  Problems  Med Hx  Surg Hx  Fam Hx     Review of Systems:  No other skin or systemic complaints except as noted in HPI or Assessment and Plan.  Objective  Well appearing patient in no apparent distress; mood and affect are within normal limits.  A full examination was performed including scalp, head, eyes, ears, nose, lips, neck, chest, axillae, abdomen, back, buttocks, bilateral upper extremities, bilateral lower extremities, hands, feet, fingers, toes, fingernails, and toenails. All findings within normal limits unless otherwise noted below.  Objective  Right tricep, right thigh: Dimpling pink papule  Objective  Right post deltoid: 0.6 x 0.3 cm slightly irregular brown macule     Objective  Right Lower Back, Right sup buttock, Suprapubic Area: Slightly irregular brown macules.  0.6 x 0.4 cm right lower back 0.7 cm right sup buttock 0.7 cm suprapubic area  Images         Assessment & Plan    Lentigines - Scattered tan macules - Discussed due to sun exposure - Benign, observe - Call for any changes  Seborrheic Keratoses - Stuck-on, waxy, tan-brown papules and plaques  - Discussed benign etiology and prognosis. - Observe - Call for any changes  Melanocytic Nevi - Tan-brown and/or pink-flesh-colored symmetric macules and papules - Benign appearing on exam today - Observation - Call clinic for new or changing moles - Recommend daily use of broad spectrum spf 30+ sunscreen to sun-exposed areas.   Hemangiomas - Red papules - Discussed benign nature - Observe - Call for any changes  Actinic Damage -  diffuse scaly erythematous macules with underlying dyspigmentation - Recommend daily broad spectrum sunscreen SPF 30+ to sun-exposed areas, reapply every 2 hours as needed.  - Call for new or changing lesions.  Skin cancer screening performed today.   Dermatofibroma Right tricep, right thigh  Benign appearing. Observe   Neoplasm of uncertain behavior of skin Right post deltoid  Epidermal / dermal shaving  Lesion diameter (cm):  0.6 Informed consent: discussed and consent obtained   Timeout: patient name, date of birth, surgical site, and procedure verified   Procedure prep:  Patient was prepped and draped in usual sterile fashion Prep type:  Isopropyl alcohol Anesthesia: the lesion was anesthetized in a standard fashion   Anesthetic:  1% lidocaine w/ epinephrine 1-100,000 buffered w/ 8.4% NaHCO3 Instrument used: flexible razor blade   Hemostasis achieved with: pressure, aluminum chloride and electrodesiccation   Outcome: patient tolerated procedure well   Post-procedure details: sterile dressing applied and wound care instructions given   Dressing type: bandage and petrolatum    Anatomic Pathology Report  Nevus (3) Right sup buttock; Suprapubic Area; Right Lower Back  Benign appearing. May play biopsy pending results from today's biopsy.  Skin cancer screening  Return in about 1 year (around 02/07/2021).  I, Ashok Cordia, CMA, am acting as scribe for Sarina Ser, MD .  Documentation: I have reviewed the above documentation for accuracy and completeness, and I agree with the above.  Sarina Ser, MD

## 2020-02-14 ENCOUNTER — Encounter: Payer: Self-pay | Admitting: Dermatology

## 2020-02-18 LAB — ANATOMIC PATHOLOGY REPORT

## 2020-02-27 ENCOUNTER — Telehealth: Payer: Self-pay

## 2020-02-27 NOTE — Telephone Encounter (Signed)
-----   Message from Ralene Bathe, MD sent at 02/22/2020 10:51 AM EDT ----- Dysplastic Moderate Recheck next visit Keep appt for 1 year  There were 3 other moles that we discussed removing if this one was abnormal, so I would suggest we have him make appt for removal of those sometime in next few months

## 2020-02-27 NOTE — Telephone Encounter (Signed)
Patient informed of pathology results 

## 2020-06-12 ENCOUNTER — Ambulatory Visit: Payer: 59 | Admitting: Dermatology

## 2020-07-02 ENCOUNTER — Other Ambulatory Visit: Payer: 59

## 2020-07-02 ENCOUNTER — Telehealth: Payer: Self-pay | Admitting: Family Medicine

## 2020-07-02 ENCOUNTER — Other Ambulatory Visit: Payer: Self-pay

## 2020-07-02 DIAGNOSIS — Z20822 Contact with and (suspected) exposure to covid-19: Secondary | ICD-10-CM

## 2020-07-02 DIAGNOSIS — R059 Cough, unspecified: Secondary | ICD-10-CM

## 2020-07-02 NOTE — Telephone Encounter (Signed)
Matthew Romero has a cold and was exposed to Covid last week. He needs Covid test done. Can you please print up the order and let him know when and where to go for test. Thanks!

## 2020-07-02 NOTE — Telephone Encounter (Signed)
Patient coming today at community testing.

## 2020-07-02 NOTE — Telephone Encounter (Signed)
Advised 

## 2020-07-04 LAB — NOVEL CORONAVIRUS, NAA: SARS-CoV-2, NAA: NOT DETECTED

## 2020-07-04 LAB — SARS-COV-2, NAA 2 DAY TAT

## 2020-07-13 ENCOUNTER — Telehealth (INDEPENDENT_AMBULATORY_CARE_PROVIDER_SITE_OTHER): Payer: 59 | Admitting: Family Medicine

## 2020-07-13 ENCOUNTER — Other Ambulatory Visit: Payer: Self-pay

## 2020-07-13 ENCOUNTER — Encounter: Payer: Self-pay | Admitting: Family Medicine

## 2020-07-13 DIAGNOSIS — F988 Other specified behavioral and emotional disorders with onset usually occurring in childhood and adolescence: Secondary | ICD-10-CM

## 2020-07-13 DIAGNOSIS — Z13220 Encounter for screening for lipoid disorders: Secondary | ICD-10-CM

## 2020-07-13 DIAGNOSIS — F322 Major depressive disorder, single episode, severe without psychotic features: Secondary | ICD-10-CM

## 2020-07-13 DIAGNOSIS — Z113 Encounter for screening for infections with a predominantly sexual mode of transmission: Secondary | ICD-10-CM | POA: Diagnosis not present

## 2020-07-13 DIAGNOSIS — R7989 Other specified abnormal findings of blood chemistry: Secondary | ICD-10-CM

## 2020-07-13 NOTE — Progress Notes (Signed)
MyChart Video Visit    Virtual Visit via Video Note   This visit type was conducted due to national recommendations for restrictions regarding the COVID-19 Pandemic (e.g. social distancing) in an effort to limit this patient's exposure and mitigate transmission in our community. This patient is at least at moderate risk for complications without adequate follow up. This format is felt to be most appropriate for this patient at this time. Physical exam was limited by quality of the video and audio technology used for the visit.   Patient location: home Provider location: office  I discussed the limitations of evaluation and management by telemedicine and the availability of in person appointments. The patient expressed understanding and agreed to proceed.  Patient: Matthew Romero   DOB: 10/25/1990   29 y.o. Male  MRN: [id number] Visit Date: 07/13/2020  Today's healthcare provider: Vernie Murders, PA-C   No chief complaint on file.  Subjective    HPI  This 30 year old male has a history of low vitamin D, ADD and clinical depression. Feeling well in general. Concerned about screening for STD with new changes in life and may become more interested in a new relationship. No specific symptoms today.  Patient Active Problem List   Diagnosis Date Noted  . Restless leg syndrome 10/01/2017  . OSA (obstructive sleep apnea) 11/05/2016  . ADD (attention deficit disorder) 03/21/2015  . Enterogastritis 03/21/2015  . Closed fracture of part of lower end of humerus 03/21/2015  . H/O respiratory system disease 03/21/2015  . Depression 03/21/2015   Past Medical History:  Diagnosis Date  . ADD (attention deficit disorder)   . Dysplastic nevus 02/08/2020   Right post. deltoid. Moderate atypia, close to edge.   . Esotropia of right eye 04/2017  . Exercise-induced asthma    prn inhaler  . Major depressive disorder   . Sleep apnea    uses CPAP nightly   Past Surgical History:   Procedure Laterality Date  . ELECTROCONVULSIVE THERAPY  08/04/2016; 08/06/2016; 08/08/2016; 08/11/2016; 08/13/2016; 08/15/2016; 08/18/2016; 08/20/2016; 08/22/2016  . STRABISMUS SURGERY Bilateral 01/30/2017   Procedure: REPAIR STRABISMUS BILATERAL;  Surgeon: Everitt Amber, MD;  Location: [facility];  Service: Ophthalmology;  Laterality: Bilateral;  . STRABISMUS SURGERY Right 06/05/2017   Procedure: REPAIR STRABISMUS RIGHT EYE;  Surgeon: Everitt Amber, MD;  Location: [facility];  Service: Ophthalmology;  Laterality: Right;   Family History  Problem Relation Age of Onset  . ADD / ADHD Mother   . Anxiety disorder Mother   . Depression Father   . ADD / ADHD Brother   . Depression Brother   . ADD / ADHD Brother   . Alcohol abuse Cousin   . Drug abuse Cousin    Social History   Tobacco Use  . Smoking status: Current Some Day Smoker    Years: 6.00    Types: Cigars  . Smokeless tobacco: Never Used  . Tobacco comment: 1 cigar/month  Vaping Use  . Vaping Use: Never used  Substance Use Topics  . Alcohol use: Yes    Comment: occasionally  . Drug use: Yes    Types: Marijuana    Comment: last used 11/2016   No Known Allergies  Medications: Outpatient Medications Prior to Visit  Medication Sig  . albuterol (PROVENTIL HFA;VENTOLIN HFA) 108 (90 Base) MCG/ACT inhaler Inhale 2 puffs into the lungs every 6 (six) hours as needed for wheezing or shortness of breath.  . amphetamine-dextroamphetamine (ADDERALL XR) 20  MG 24 hr capsule Take 40 mg by mouth daily.  Marland Kitchen FLUoxetine (PROZAC) 40 MG capsule Take 80 mg by mouth daily.  Marland Kitchen amphetamine-dextroamphetamine (ADDERALL) 20 MG tablet Take 20 mg by mouth daily. (Patient not taking: Reported on 07/13/2020)  . rOPINIRole (REQUIP) 2 MG tablet Take 1 tablet (2 mg total) by mouth at bedtime. (Patient not taking: No sig reported)  . traZODone (DESYREL) 150 MG tablet Take 150 mg by mouth at bedtime. (Patient not taking: Reported on 07/13/2020)    No facility-administered medications prior to visit.    Review of Systems  Constitutional: Negative.   Respiratory: Negative.   Cardiovascular: Negative.   Neurological: Negative for dizziness, light-headedness and headaches.    Objective    There were no vitals taken for this visit.   Physical Exam: WDWN male in no apparent distress.  Head: Normocephalic, atraumatic. Neck: Supple, NROM Respiratory: No apparent distress Psych: Normal mood and affect    Assessment & Plan     1. Attention deficit disorder, unspecified hyperactivity presence Tolerating the Adderall with no significant changes in sleep pattern. Recheck follow up labs. - CBC with Differential/Platelet; Future - Comprehensive metabolic panel; Future - TSH; Future  2. Current severe episode of major depressive disorder without psychotic features without prior episode ([place]) Stable and has tapered down on the Fluoxetine. Feeling well and in bright spirits today. Recheck routine labs. - CBC with Differential/Platelet; Future - Comprehensive metabolic panel; Future - TSH; Future - VITAMIN D 25 Hydroxy (Vit-D Deficiency, Fractures); Future  3. Screening cholesterol level - Comprehensive metabolic panel; Future - Lipid panel; Future - TSH; Future  4. Screening for STD (sexually transmitted disease) No sexually active recently but interested in developing a new relationship. - RPR+HIV+GC+CT Panel; Future  5. Low vitamin D level History of low Vitamin D level. Recheck levels. - CBC with Differential/Platelet; Future - VITAMIN D 25 Hydroxy (Vit-D Deficiency, Fractures); Future   No follow-ups on file.     I discussed the assessment and treatment plan with the patient. The patient was provided an opportunity to ask questions and all were answered. The patient agreed with the plan and demonstrated an understanding of the instructions.   The patient was advised to call back or seek an in-person evaluation if  the symptoms worsen or if the condition fails to improve as anticipated.  I provided 20 minutes of non-face-to-face time during this encounter.  I, Hopelynn Gartland, PA-C, have reviewed all documentation for this visit. The documentation on 07/13/20 for the exam, diagnosis, procedures, and orders are all accurate and complete.   Vernie Murders, PA-C [employer] [phone] (phone) [phone] (fax)  [place]

## 2020-07-23 ENCOUNTER — Other Ambulatory Visit: Payer: Self-pay | Admitting: Family Medicine

## 2020-07-24 LAB — COMPREHENSIVE METABOLIC PANEL
BUN: 12 mg/dL (ref 6–20)
Bilirubin Total: 0.3 mg/dL (ref 0.0–1.2)

## 2020-07-24 LAB — CBC WITH DIFFERENTIAL/PLATELET: MCH: 29.1 pg (ref 26.6–33.0)

## 2020-07-25 LAB — COMPREHENSIVE METABOLIC PANEL
ALT: 23 IU/L (ref 0–44)
AST: 18 IU/L (ref 0–40)
Albumin/Globulin Ratio: 2.2 (ref 1.2–2.2)
Albumin: 4.3 g/dL (ref 4.1–5.2)
Alkaline Phosphatase: 72 IU/L (ref 44–121)
BUN/Creatinine Ratio: 12 (ref 9–20)
CO2: 25 mmol/L (ref 20–29)
Calcium: 9.5 mg/dL (ref 8.7–10.2)
Chloride: 104 mmol/L (ref 96–106)
Creatinine, Ser: 0.99 mg/dL (ref 0.76–1.27)
GFR calc Af Amer: 119 mL/min/{1.73_m2} (ref >59)
GFR calc non Af Amer: 103 mL/min/{1.73_m2} (ref >59)
Globulin, Total: 2 g/dL (ref 1.5–4.5)
Glucose: 88 mg/dL (ref 65–99)
Potassium: 4.6 mmol/L (ref 3.5–5.2)
Sodium: 141 mmol/L (ref 134–144)
Total Protein: 6.3 g/dL (ref 6.0–8.5)

## 2020-07-25 LAB — RPR+HIV+GC+CT PANEL
Chlamydia trachomatis, NAA: NEGATIVE
HIV Screen 4th Generation wRfx: NONREACTIVE
Neisseria Gonorrhoeae by PCR: NEGATIVE
RPR Ser Ql: NONREACTIVE

## 2020-07-25 LAB — CBC WITH DIFFERENTIAL/PLATELET
Basophils Absolute: 0.1 10*3/uL (ref 0.0–0.2)
Basos: 1 %
EOS (ABSOLUTE): 0.2 10*3/uL (ref 0.0–0.4)
Eos: 4 %
Hematocrit: 42 % (ref 37.5–51.0)
Hemoglobin: 14.3 g/dL (ref 13.0–17.7)
Immature Grans (Abs): 0 10*3/uL (ref 0.0–0.1)
Immature Granulocytes: 0 %
Lymphocytes Absolute: 1.6 10*3/uL (ref 0.7–3.1)
Lymphs: 40 %
MCHC: 34 g/dL (ref 31.5–35.7)
MCV: 85 fL (ref 79–97)
Monocytes Absolute: 0.3 10*3/uL (ref 0.1–0.9)
Monocytes: 8 %
Neutrophils Absolute: 1.9 10*3/uL (ref 1.4–7.0)
Neutrophils: 47 %
Platelets: 179 10*3/uL (ref 150–450)
RBC: 4.92 x10E6/uL (ref 4.14–5.80)
RDW: 12.1 % (ref 11.6–15.4)
WBC: 4.1 10*3/uL (ref 3.4–10.8)

## 2020-07-25 LAB — LIPID PANEL
Chol/HDL Ratio: 2.6 ratio (ref 0.0–5.0)
Cholesterol, Total: 151 mg/dL (ref 100–199)
HDL: 59 mg/dL (ref >39)
LDL Chol Calc (NIH): 75 mg/dL (ref 0–99)
Triglycerides: 94 mg/dL (ref 0–149)
VLDL Cholesterol Cal: 17 mg/dL (ref 5–40)

## 2020-07-25 LAB — VITAMIN D 25 HYDROXY (VIT D DEFICIENCY, FRACTURES): Vit D, 25-Hydroxy: 18.9 ng/mL — ABNORMAL LOW (ref 30.0–100.0)

## 2020-07-25 LAB — TSH: TSH: 1.8 u[IU]/mL (ref 0.450–4.500)

## 2020-09-10 ENCOUNTER — Telehealth: Payer: Self-pay

## 2020-09-10 NOTE — Telephone Encounter (Signed)
Pt returning our call  ° °

## 2020-09-10 NOTE — Telephone Encounter (Signed)
Patient advised Dr. Nehemiah Massed out of office. Rescheduled for more biopsies.

## 2020-09-12 ENCOUNTER — Ambulatory Visit: Payer: Managed Care, Other (non HMO) | Admitting: Dermatology

## 2020-09-26 ENCOUNTER — Encounter: Payer: Self-pay | Admitting: Dermatology

## 2020-09-26 ENCOUNTER — Other Ambulatory Visit: Payer: Self-pay

## 2020-09-26 ENCOUNTER — Ambulatory Visit: Payer: Managed Care, Other (non HMO) | Admitting: Dermatology

## 2020-09-26 DIAGNOSIS — D229 Melanocytic nevi, unspecified: Secondary | ICD-10-CM

## 2020-09-26 DIAGNOSIS — Z86018 Personal history of other benign neoplasm: Secondary | ICD-10-CM

## 2020-09-26 DIAGNOSIS — D225 Melanocytic nevi of trunk: Secondary | ICD-10-CM | POA: Diagnosis not present

## 2020-09-26 DIAGNOSIS — D485 Neoplasm of uncertain behavior of skin: Secondary | ICD-10-CM

## 2020-09-26 NOTE — Progress Notes (Signed)
   Follow-Up Visit   Subjective  Matthew Romero is a 30 y.o. male who presents for the following: Follow-up (Patient saw Dr. Nehemiah Massed in 01/2020 and had a mole check. One bx was done at right post deltoid which came back with moderate atypia. There were a few other moles Dr. Raliegh Ip suggested considering biopsy of if the prior bx showed any atypia. ).  The following portions of the chart were reviewed this encounter and updated as appropriate:   Tobacco  Allergies  Meds  Problems  Med Hx  Surg Hx  Fam Hx      Review of Systems:  No other skin or systemic complaints except as noted in HPI or Assessment and Plan.  Objective  Well appearing patient in no apparent distress; mood and affect are within normal limits.  A focused examination was performed including back, buttock, right shoulder. suprapubic. Relevant physical exam findings are noted in the Assessment and Plan.  Objective  Right Lower Back: 0.75 x 0.4cm slightly irregular brown macule  Right superior buttock; Suprapubic Area  Objective  Right superior buttock: 0.8cm slightly irregular brown macule  Suprapubic Area: 1.0cm slightly irregular brown macule with some perifollicular dropout  Images           Assessment & Plan  Neoplasm of uncertain behavior of skin Right Lower Back  Epidermal / dermal shaving  Lesion diameter (cm):  0.8 Informed consent: discussed and consent obtained   Timeout: patient name, date of birth, surgical site, and procedure verified   Patient was prepped and draped in usual sterile fashion: area prepped with isopropyl alcohol. Anesthesia: the lesion was anesthetized in a standard fashion   Anesthetic:  1% lidocaine w/ epinephrine 1-100,000 buffered w/ 8.4% NaHCO3 Instrument used: DermaBlade   Hemostasis achieved with: aluminum chloride   Outcome: patient tolerated procedure well   Post-procedure details: wound care instructions given   Additional details:  Mupirocin and a  bandage applied  Specimen 1 - Surgical pathology Differential Diagnosis: r/o Atypia  Check Margins: No 0.75 x 0.4cm slightly irregular brown macule  Nevus (2) Right superior buttock; Suprapubic Area  Benign-appearing.  Will recheck.  Call clinic for new or changing lesions.  Recommend daily use of broad spectrum spf 30+ sunscreen to sun-exposed areas.   Photos taken today. Recommend observe for changes and recheck at follow-up. Stable compared to previous photos.   History of Dysplastic Nevi - No evidence of recurrence today at right posterior deltoid - Recommend regular full body skin exams - Recommend daily broad spectrum sunscreen SPF 30+ to sun-exposed areas, reapply every 2 hours as needed.  - Call if any new or changing lesions are noted between office visits  Return for as scheduled.  Graciella Belton, RMA, am acting as scribe for Forest Gleason, MD .  Documentation: I have reviewed the above documentation for accuracy and completeness, and I agree with the above.  Forest Gleason, MD

## 2020-09-26 NOTE — Patient Instructions (Signed)

## 2020-10-02 ENCOUNTER — Telehealth: Payer: Self-pay

## 2020-10-02 NOTE — Telephone Encounter (Signed)
Called and discussed results and provider's recommendations with patient concerning biopsy on right lower back. Patient verbalized understanding and states he has follow up with Dr. Nehemiah Massed schedule in August. Patient denied further questions at this time.

## 2020-10-02 NOTE — Telephone Encounter (Signed)
-----   Message from Alfonso Patten, MD sent at 10/02/2020  3:11 PM EDT ----- Skin , right lower back DYSPLASTIC COMPOUND NEVUS WITH MODERATE ATYPIA, LIMITED MARGINS FREE  This is a MODERATELY ATYPICAL MOLE. On the spectrum from normal mole to melanoma skin cancer, this is in between the two. - We need to recheck this area sometime in the next 6 months to be sure there is no evidence of the atypical mole coming back. If there is any color coming back, we would recommend repeating the biopsy to be sure the cells look normal. Can recheck at follow-up appointment in August 2022. - People who have a history of atypical moles do have a slightly increased risk of developing melanoma somewhere on the body, so a yearly full body skin exam by a dermatologist is recommended.  - Monthly self skin checks and daily sun protection are also recommended.  - Please call if you notice a dark spot coming back where this biopsy was taken.  - Please also call if you notice any new or changing spots anywhere else on the body before your follow-up visit.    MAs please call. Thank you!

## 2021-02-07 ENCOUNTER — Encounter: Payer: 59 | Admitting: Dermatology

## 2021-02-11 ENCOUNTER — Other Ambulatory Visit: Payer: Self-pay

## 2021-02-11 ENCOUNTER — Ambulatory Visit: Payer: Managed Care, Other (non HMO) | Admitting: Dermatology

## 2021-02-11 DIAGNOSIS — D2261 Melanocytic nevi of right upper limb, including shoulder: Secondary | ICD-10-CM | POA: Diagnosis not present

## 2021-02-11 DIAGNOSIS — D225 Melanocytic nevi of trunk: Secondary | ICD-10-CM

## 2021-02-11 DIAGNOSIS — D2371 Other benign neoplasm of skin of right lower limb, including hip: Secondary | ICD-10-CM

## 2021-02-11 DIAGNOSIS — Z1283 Encounter for screening for malignant neoplasm of skin: Secondary | ICD-10-CM

## 2021-02-11 DIAGNOSIS — D485 Neoplasm of uncertain behavior of skin: Secondary | ICD-10-CM

## 2021-02-11 DIAGNOSIS — D229 Melanocytic nevi, unspecified: Secondary | ICD-10-CM

## 2021-02-11 DIAGNOSIS — D239 Other benign neoplasm of skin, unspecified: Secondary | ICD-10-CM

## 2021-02-11 NOTE — Progress Notes (Signed)
   Follow-Up Visit   Subjective  ROLEN OSE III is a 30 y.o. male who presents for the following: Annual Exam (Hx dysplastic nevi - patient has noticed a lesion on his R hip that he would like checked today.). The patient presents for Total-Body Skin Exam (TBSE) for skin cancer screening and mole check.  The following portions of the chart were reviewed this encounter and updated as appropriate:   Tobacco  Allergies  Meds  Problems  Med Hx  Surg Hx  Fam Hx     Review of Systems:  No other skin or systemic complaints except as noted in HPI or Assessment and Plan.  Objective  Well appearing patient in no apparent distress; mood and affect are within normal limits.  A full examination was performed including scalp, head, eyes, ears, nose, lips, neck, chest, axillae, abdomen, back, buttocks, bilateral upper extremities, bilateral lower extremities, hands, feet, fingers, toes, fingernails, and toenails. All findings within normal limits unless otherwise noted below.  R ant hip/waistline Firm pink/brown papulenodule with dimple sign.   R infraclavicular 0.6 cm irregular brown macule.  suprapubic area 0.7 cm brown macule.  R sup buttock 0.8 cm brown macule.   Assessment & Plan  Dermatofibroma R ant hip/waistline Benign-appearing.  Observation.  Call clinic for new or changing lesions.  Recommend daily use of broad spectrum spf 30+ sunscreen to sun-exposed areas.   Neoplasm of uncertain behavior of skin R infraclavicular Epidermal / dermal shaving  Lesion diameter (cm):  0.6 Informed consent: discussed and consent obtained   Timeout: patient name, date of birth, surgical site, and procedure verified   Procedure prep:  Patient was prepped and draped in usual sterile fashion Prep type:  Isopropyl alcohol Anesthesia: the lesion was anesthetized in a standard fashion   Anesthetic:  1% lidocaine w/ epinephrine 1-100,000 buffered w/ 8.4% NaHCO3 Instrument used: flexible  razor blade   Hemostasis achieved with: pressure, aluminum chloride and electrodesiccation   Outcome: patient tolerated procedure well   Post-procedure details: sterile dressing applied and wound care instructions given   Dressing type: bandage and petrolatum    Specimen 1 - Surgical pathology Differential Diagnosis: D48.5 r/o dysplastic nevus  Check Margins: No  Nevus (2) R sup buttock; suprapubic area Benign-appearing.  Observation.  Call clinic for new or changing moles.  Recommend daily use of broad spectrum spf 30+ sunscreen to sun-exposed areas. No change compared to photo.  Skin cancer screening  Return in about 1 year (around 02/11/2022) for TBSE.  Luther Redo, CMA, am acting as scribe for Sarina Ser, MD . Documentation: I have reviewed the above documentation for accuracy and completeness, and I agree with the above.  Sarina Ser, MD

## 2021-02-11 NOTE — Patient Instructions (Signed)
If you have any questions or concerns for your doctor, please call our main line at [phone] and press option 4 to reach your doctor's medical assistant. If no one answers, please leave a voicemail as directed and we will return your call as soon as possible. Messages left after 4 pm will be answered the following business day.   You may also send us a message via MyChart. We typically respond to MyChart messages within 1-2 business days.  For prescription refills, please ask your pharmacy to contact our office. Our fax number is [phone].  If you have an urgent issue when the clinic is closed that cannot wait until the next business day, you can page your doctor at the number below.    Please note that while we do our best to be available for urgent issues outside of office hours, we are not available 24/7.   If you have an urgent issue and are unable to reach us, you may choose to seek medical care at your doctor's office, retail clinic, urgent care center, or emergency room.  If you have a medical emergency, please immediately call 911 or go to the emergency department.  Pager Numbers  - Dr. Kowalski: [phone]  - Dr. Moye: [phone]  - Dr. Stewart: [phone]  In the event of inclement weather, please call our main line at [phone] for an update on the status of any delays or closures.  Dermatology Medication Tips: Please keep the boxes that topical medications come in in order to help keep track of the instructions about where and how to use these. Pharmacies typically print the medication instructions only on the boxes and not directly on the medication tubes.   If your medication is too expensive, please contact our office at [phone] option 4 or send us a message through MyChart.   We are unable to tell what your co-pay for medications will be in advance as this is different depending on your insurance coverage. However, we may be able to find a substitute  medication at lower cost or fill out paperwork to get insurance to cover a needed medication.   If a prior authorization is required to get your medication covered by your insurance company, please allow us 1-2 business days to complete this process.  Drug prices often vary depending on where the prescription is filled and some pharmacies may offer cheaper prices.  The website www.goodrx.com contains coupons for medications through different pharmacies. The prices here do not account for what the cost may be with help from insurance (it may be cheaper with your insurance), but the website can give you the price if you did not use any insurance.  - You can print the associated coupon and take it with your prescription to the pharmacy.  - You may also stop by our office during regular business hours and pick up a GoodRx coupon card.  - If you need your prescription sent electronically to a different pharmacy, notify our office through [facility] MyChart or by phone at [phone] option 4.   Wound Care Instructions  Cleanse wound gently with soap and water once a day then pat dry with clean gauze. Apply a thing coat of Petrolatum (petroleum jelly, "Vaseline") over the wound (unless you have an allergy to this). We recommend that you use a new, sterile tube of Vaseline. Do not pick or remove scabs. Do not remove the yellow or white "healing tissue" from the base of the wound.  Cover the   wound with fresh, clean, nonstick gauze and secure with paper tape. You may use Band-Aids in place of gauze and tape if the would is small enough, but would recommend trimming much of the tape off as there is often too much. Sometimes Band-Aids can irritate the skin.  You should call the office for your biopsy report after 1 week if you have not already been contacted.  If you experience any problems, such as abnormal amounts of bleeding, swelling, significant bruising, significant pain, or evidence of infection,  please call the office immediately.  FOR ADULT SURGERY PATIENTS: If you need something for pain relief you may take 1 extra strength Tylenol (acetaminophen) AND 2 Ibuprofen (200mg each) together every 4 hours as needed for pain. (do not take these if you are allergic to them or if you have a reason you should not take them.) Typically, you may only need pain medication for 1 to 3 days.    

## 2021-02-16 ENCOUNTER — Encounter: Payer: Self-pay | Admitting: Dermatology

## 2021-02-18 ENCOUNTER — Telehealth: Payer: Self-pay

## 2021-02-18 NOTE — Telephone Encounter (Signed)
-----   Message from Ralene Bathe, MD sent at 02/16/2021  6:48 PM EDT ----- Diagnosis Skin , R infraclavicular DYSPLASTIC COMPOUND NEVUS WITH MILD ATYPIA, DEEP MARGIN INVOLVED  Mild dysplastic Recheck next visit

## 2021-02-18 NOTE — Telephone Encounter (Signed)
Advised pt of bx results 

## 2021-05-28 ENCOUNTER — Ambulatory Visit
Admission: RE | Admit: 2021-05-28 | Discharge: 2021-05-28 | Disposition: A | Discharge status: Home / Self Care | Payer: Managed Care, Other (non HMO) | Source: Ambulatory Visit | Attending: Family Medicine | Admitting: Family Medicine

## 2021-05-28 ENCOUNTER — Ambulatory Visit (INDEPENDENT_AMBULATORY_CARE_PROVIDER_SITE_OTHER): Payer: Managed Care, Other (non HMO) | Admitting: Family Medicine

## 2021-05-28 ENCOUNTER — Encounter: Payer: Self-pay | Admitting: Family Medicine

## 2021-05-28 ENCOUNTER — Telehealth: Payer: Self-pay

## 2021-05-28 ENCOUNTER — Ambulatory Visit
Admission: RE | Admit: 2021-05-28 | Discharge: 2021-05-28 | Disposition: A | Discharge status: Home / Self Care | Payer: Managed Care, Other (non HMO) | Source: Ambulatory Visit | Attending: Urology | Admitting: Urology

## 2021-05-28 ENCOUNTER — Encounter: Payer: Self-pay | Admitting: Urology

## 2021-05-28 ENCOUNTER — Ambulatory Visit: Payer: Self-pay | Admitting: *Deleted

## 2021-05-28 ENCOUNTER — Ambulatory Visit: Payer: Managed Care, Other (non HMO) | Admitting: Registered Nurse

## 2021-05-28 ENCOUNTER — Other Ambulatory Visit: Payer: Self-pay

## 2021-05-28 ENCOUNTER — Encounter
Admission: RE | Disposition: A | Discharge status: Home / Self Care | Payer: Self-pay | Source: Ambulatory Visit | Attending: Urology

## 2021-05-28 ENCOUNTER — Ambulatory Visit: Payer: Managed Care, Other (non HMO) | Admitting: Urology

## 2021-05-28 ENCOUNTER — Other Ambulatory Visit: Payer: Self-pay | Admitting: Family Medicine

## 2021-05-28 VITALS — BP 114/74 | HR 57 | Ht 75.0 in | Wt 180.0 lb

## 2021-05-28 VITALS — BP 107/68 | HR 52 | Temp 98.5°F | Resp 16

## 2021-05-28 DIAGNOSIS — N4400 Torsion of testis, unspecified: Secondary | ICD-10-CM

## 2021-05-28 DIAGNOSIS — F17200 Nicotine dependence, unspecified, uncomplicated: Secondary | ICD-10-CM | POA: Diagnosis not present

## 2021-05-28 DIAGNOSIS — N50811 Right testicular pain: Secondary | ICD-10-CM

## 2021-05-28 DIAGNOSIS — N50819 Testicular pain, unspecified: Secondary | ICD-10-CM | POA: Diagnosis not present

## 2021-05-28 HISTORY — PX: ORCHIOPEXY: SHX479

## 2021-05-28 LAB — URINALYSIS, COMPLETE
Bilirubin, UA: NEGATIVE
Glucose, UA: NEGATIVE
Ketones, UA: NEGATIVE
Leukocytes,UA: NEGATIVE
Nitrite, UA: NEGATIVE
Protein,UA: NEGATIVE
RBC, UA: NEGATIVE
Specific Gravity, UA: 1.025 (ref 1.005–1.030)
Urobilinogen, Ur: 0.2 mg/dL (ref 0.2–1.0)
pH, UA: 6 (ref 5.0–7.5)

## 2021-05-28 LAB — MICROSCOPIC EXAMINATION
Bacteria, UA: NONE SEEN
Epithelial Cells (non renal): NONE SEEN /hpf (ref 0–10)
RBC, Urine: NONE SEEN /hpf (ref 0–2)
WBC, UA: NONE SEEN /hpf (ref 0–5)

## 2021-05-28 SURGERY — ORCHIOPEXY ADULT
Anesthesia: General | Laterality: Bilateral

## 2021-05-28 MED ORDER — CHLORHEXIDINE GLUCONATE 0.12 % MT SOLN
15.0000 mL | Freq: Once | OROMUCOSAL | Status: AC
  Administered 2021-05-28: 15 mL via OROMUCOSAL

## 2021-05-28 MED ORDER — FENTANYL CITRATE (PF) 100 MCG/2ML IJ SOLN
25.0000 ug | INTRAMUSCULAR | Status: DC | PRN
  Administered 2021-05-28 (×3): 50 ug via INTRAVENOUS

## 2021-05-28 MED ORDER — OXYCODONE HCL 5 MG PO TABS
5.0000 mg | ORAL_TABLET | Freq: Once | ORAL | Status: AC
  Administered 2021-05-28: 5 mg via ORAL

## 2021-05-28 MED ORDER — BUPIVACAINE HCL 0.5 % IJ SOLN
INTRAMUSCULAR | Status: DC | PRN
  Administered 2021-05-28: 10 mL

## 2021-05-28 MED ORDER — PROPOFOL 10 MG/ML IV BOLUS
INTRAVENOUS | Status: DC | PRN
  Administered 2021-05-28: 50 mg via INTRAVENOUS
  Administered 2021-05-28: 200 mg via INTRAVENOUS

## 2021-05-28 MED ORDER — HYDROCODONE-ACETAMINOPHEN 5-325 MG PO TABS
1.0000 | ORAL_TABLET | Freq: Four times a day (QID) | ORAL | Status: DC | PRN
Start: 2021-05-28 — End: 2021-05-29
  Administered 2021-05-28: 1 via ORAL

## 2021-05-28 MED ORDER — PROMETHAZINE HCL 25 MG/ML IJ SOLN
6.2500 mg | INTRAMUSCULAR | Status: DC | PRN
  Administered 2021-05-28: 6.25 mg via INTRAVENOUS

## 2021-05-28 MED ORDER — LIDOCAINE HCL (CARDIAC) PF 100 MG/5ML IV SOSY
PREFILLED_SYRINGE | INTRAVENOUS | Status: DC | PRN
  Administered 2021-05-28: 100 mg via INTRAVENOUS

## 2021-05-28 MED ORDER — LACTATED RINGERS IV SOLN: INTRAVENOUS | Status: DC

## 2021-05-28 MED ORDER — CEFAZOLIN (ANCEF) 1 G IV SOLR
2.0000 g | INTRAVENOUS | Status: AC
  Administered 2021-05-28: 2 g

## 2021-05-28 MED ORDER — DEXAMETHASONE SODIUM PHOSPHATE 10 MG/ML IJ SOLN
INTRAMUSCULAR | Status: DC | PRN
  Administered 2021-05-28: 10 mg via INTRAVENOUS

## 2021-05-28 MED ORDER — ORAL CARE MOUTH RINSE: 15.0000 mL | Freq: Once | OROMUCOSAL | Status: AC

## 2021-05-28 MED ORDER — MIDAZOLAM HCL 2 MG/2ML IJ SOLN
INTRAMUSCULAR | Status: DC | PRN
  Administered 2021-05-28: 2 mg via INTRAVENOUS

## 2021-05-28 MED ORDER — EPHEDRINE SULFATE 50 MG/ML IJ SOLN
INTRAMUSCULAR | Status: DC | PRN
  Administered 2021-05-28: 5 mg via INTRAVENOUS

## 2021-05-28 MED ORDER — HYDROCODONE-ACETAMINOPHEN 5-325 MG PO TABS
1.0000 | ORAL_TABLET | Freq: Four times a day (QID) | ORAL | 0 refills | Status: DC | PRN
Start: 2021-05-28 — End: 2021-07-03
  Filled 2021-05-28: qty 10, 2d supply, fill #0

## 2021-05-28 MED ORDER — ONDANSETRON HCL 4 MG/2ML IJ SOLN
INTRAMUSCULAR | Status: DC | PRN
  Administered 2021-05-28: 4 mg via INTRAVENOUS

## 2021-05-28 MED ORDER — DOCUSATE SODIUM 100 MG PO CAPS
100.0000 mg | ORAL_CAPSULE | Freq: Two times a day (BID) | ORAL | 0 refills | Status: DC
  Filled 2021-05-28: qty 30, 15d supply, fill #0

## 2021-05-28 MED ORDER — FENTANYL CITRATE (PF) 100 MCG/2ML IJ SOLN
INTRAMUSCULAR | Status: DC | PRN
  Administered 2021-05-28 (×2): 50 ug via INTRAVENOUS

## 2021-05-28 SURGICAL SUPPLY — 44 items
ADH SKN CLS APL DERMABOND .7 (GAUZE/BANDAGES/DRESSINGS) ×1
APL PRP STRL LF DISP 70% ISPRP (MISCELLANEOUS) ×1
BLADE SURG 15 STRL LF DISP TIS (BLADE) ×1 IMPLANT
BLADE SURG 15 STRL SS (BLADE) ×2
CHLORAPREP W/TINT 26 (MISCELLANEOUS) ×2 IMPLANT
DERMABOND ADVANCED (GAUZE/BANDAGES/DRESSINGS) ×1
DERMABOND ADVANCED .7 DNX12 (GAUZE/BANDAGES/DRESSINGS) ×1 IMPLANT
DRAIN PENROSE 0.625X18 (DRAIN) IMPLANT
DRAIN PENROSE 12X.25 LTX STRL (MISCELLANEOUS) ×2 IMPLANT
DRAPE LAPAROTOMY 77X122 PED (DRAPES) ×2 IMPLANT
DRSG GAUZE FLUFF 36X18 (GAUZE/BANDAGES/DRESSINGS) ×2 IMPLANT
DRSG TEGADERM 4X4.75 (GAUZE/BANDAGES/DRESSINGS) IMPLANT
DRSG TELFA 4X3 1S NADH ST (GAUZE/BANDAGES/DRESSINGS) IMPLANT
ELECT REM PT RETURN 9FT ADLT (ELECTROSURGICAL) ×2
ELECTRODE REM PT RTRN 9FT ADLT (ELECTROSURGICAL) ×1 IMPLANT
GAUZE 4X4 16PLY ~~LOC~~+RFID DBL (SPONGE) ×2 IMPLANT
GEL ULTRASOUND 20GR AQUASONIC (MISCELLANEOUS) IMPLANT
GLOVE SURG ENC MOIS LTX SZ6.5 (GLOVE) ×2 IMPLANT
GLOVE SURG UNDER POLY LF SZ6.5 (GLOVE) ×2 IMPLANT
GOWN STRL REUS W/ TWL LRG LVL3 (GOWN DISPOSABLE) ×2 IMPLANT
GOWN STRL REUS W/TWL LRG LVL3 (GOWN DISPOSABLE) ×4
KIT TURNOVER KIT A (KITS) ×2 IMPLANT
LABEL OR SOLS (LABEL) ×2 IMPLANT
MANIFOLD NEPTUNE II (INSTRUMENTS) ×2 IMPLANT
NDL HPO THNWL 1X22GA REG BVL (NEEDLE) ×1 IMPLANT
NEEDLE SAFETY 22GX1 (NEEDLE) ×2
PACK BASIN MINOR ARMC (MISCELLANEOUS) ×2 IMPLANT
SPONGE KITTNER 5P (MISCELLANEOUS) ×2 IMPLANT
STRIP CLOSURE SKIN 1/2X4 (GAUZE/BANDAGES/DRESSINGS) IMPLANT
SUPPORETR ATHLETIC LG (MISCELLANEOUS) ×1 IMPLANT
SUPPORTER ATHLETIC LG (MISCELLANEOUS) ×2
SUT MNCRL 4-0 (SUTURE)
SUT MNCRL 4-0 27XMFL (SUTURE)
SUT PROLENE 4 0 BB (SUTURE) ×4 IMPLANT
SUT SILK 0 SH 30 (SUTURE) ×2 IMPLANT
SUT SILK 2 0 SH (SUTURE) IMPLANT
SUT VIC AB 3-0 SH 27 (SUTURE) ×4
SUT VIC AB 3-0 SH 27X BRD (SUTURE) ×2 IMPLANT
SUT VIC AB 4-0 FS2 27 (SUTURE) IMPLANT
SUT VICRYL PLUS ABS 0 54 (SUTURE) ×2 IMPLANT
SUTURE MNCRL 4-0 27XMF (SUTURE) IMPLANT
SYR 20ML LL LF (SYRINGE) ×2 IMPLANT
WATER STERILE IRR 1000ML POUR (IV SOLUTION) ×2 IMPLANT
WATER STERILE IRR 500ML POUR (IV SOLUTION) ×2 IMPLANT

## 2021-05-28 NOTE — Anesthesia Preprocedure Evaluation (Signed)
Anesthesia Evaluation  Patient identified by MRN, date of birth, ID band Patient awake    Reviewed: Allergy & Precautions, H&P , NPO status , Patient's Chart, lab work & pertinent test results, reviewed documented beta blocker date and time   History of Anesthesia Complications (+) Family history of anesthesia reaction and history of anesthetic complications  Airway Mallampati: II  TM Distance: >3 FB Neck ROM: full    Dental  (+) Chipped, Dental Advidsory Given   Pulmonary neg shortness of breath, asthma , neg sleep apnea, neg COPD, neg recent URI, Current Smoker,           Cardiovascular Exercise Tolerance: Good negative cardio ROS       Neuro/Psych PSYCHIATRIC DISORDERS (Depression and ADHD) Depression negative neurological ROS     GI/Hepatic negative GI ROS, Neg liver ROS,   Endo/Other  negative endocrine ROS  Renal/GU negative Renal ROS  negative genitourinary   Musculoskeletal   Abdominal   Peds  Hematology negative hematology ROS (+)   Anesthesia Other Findings Past Medical History: No date: ADHD (attention deficit hyperactivity disorder) No date: Asthma No date: Depression   Reproductive/Obstetrics negative OB ROS                             Anesthesia Physical  Anesthesia Plan  ASA: II  Anesthesia Plan: General   Post-op Pain Management:    Induction: Intravenous  PONV Risk Score and Plan: 1 and Ondansetron, Dexamethasone, Midazolam and Promethazine  Airway Management Planned: LMA and Oral ETT  Additional Equipment:   Intra-op Plan:   Post-operative Plan: Extubation in OR  Informed Consent: I have reviewed the patients History and Physical, chart, labs and discussed the procedure including the risks, benefits and alternatives for the proposed anesthesia with the patient or authorized representative who has indicated his/her understanding and acceptance.      Dental Advisory Given  Plan Discussed with: Anesthesiologist, CRNA and Surgeon  Anesthesia Plan Comments:         Anesthesia Quick Evaluation

## 2021-05-28 NOTE — Progress Notes (Signed)
05/28/21 10:35 AM   Matthew Romero 11-10-1990 [id number]  Referring provider:  Birdie Sons, MD [address] [place],  [place] [address] Chief Complaint  Patient presents with   Testicle Pain     HPI: Matthew Romero is a 30 y.o.male who further evaluation of testicular pain.    He reports that he experineced sharp shooting pain in his lower right testicle which woke him up around 3 AM. He has not had anything like this before. He experienced nausea but no vomitting. His pain is waxing and waning.  Currently its not severe.  He tolerated the ultrasound fine but afterwards, started to have some dull right testicular pain not nearly as severe as the pain in the middle of the night.  Scrotal ultrasound today revealed slightly diminished arterial and venous Doppler flow on the right relative to the left, concerning for partial or intermittent torsion in the setting of acutely referable pain.   He ate a bagel at 8 AM this morning as well as a coffee with creamer around 10 AM.   PMH: Past Medical History:  Diagnosis Date   ADD (attention deficit disorder)    Dysplastic nevus 02/08/2020   Right post. deltoid. Moderate atypia, close to edge.    Dysplastic nevus 09/26/2020   DYSPLASTIC COMPOUND NEVUS WITH MODERATE ATYPIA, LIMITED MARGINS FREE on right upper back   Dysplastic nevus 02/11/2021   R infraclavicular, mild atypia   Esotropia of right eye 04/2017   Exercise-induced asthma    prn inhaler   Major depressive disorder    Sleep apnea    uses CPAP nightly    Surgical History: Past Surgical History:  Procedure Laterality Date   ELECTROCONVULSIVE THERAPY  08/04/2016; 08/06/2016; 08/08/2016; 08/11/2016; 08/13/2016; 08/15/2016; 08/18/2016; 08/20/2016; 08/22/2016   STRABISMUS SURGERY Bilateral 01/30/2017   Procedure: REPAIR STRABISMUS BILATERAL;  Surgeon: Everitt Amber, MD;  Location: [facility];  Service: Ophthalmology;  Laterality: Bilateral;    STRABISMUS SURGERY Right 06/05/2017   Procedure: REPAIR STRABISMUS RIGHT EYE;  Surgeon: Everitt Amber, MD;  Location: [facility];  Service: Ophthalmology;  Laterality: Right;    Home Medications:  Allergies as of 05/28/2021   No Known Allergies      Medication List        Accurate as of May 28, 2021 10:35 AM. If you have any questions, ask your nurse or doctor.          STOP taking these medications    rOPINIRole 2 MG tablet Commonly known as: Requip Stopped by: Hollice Espy, MD   traZODone 150 MG tablet Commonly known as: DESYREL Stopped by: Hollice Espy, MD       TAKE these medications    albuterol 108 (90 Base) MCG/ACT inhaler Commonly known as: VENTOLIN HFA Inhale 2 puffs into the lungs every 6 (six) hours as needed for wheezing or shortness of breath.   amphetamine-dextroamphetamine 20 MG 24 hr capsule Commonly known as: ADDERALL XR Take 40 mg by mouth daily.   amphetamine-dextroamphetamine 20 MG tablet Commonly known as: ADDERALL Take 20 mg by mouth daily.   FLUoxetine 40 MG capsule Commonly known as: PROZAC Take 80 mg by mouth daily.        Allergies: No Known Allergies  Family History: Family History  Problem Relation Age of Onset   ADD / ADHD Mother    Anxiety disorder Mother    Depression Father    ADD / ADHD Brother  Depression Brother    ADD / ADHD Brother    Alcohol abuse Cousin    Drug abuse Cousin     Social History:  reports that he has been smoking cigars. He has never used smokeless tobacco. He reports current alcohol use. He reports current drug use. Drug: Marijuana.   Physical Exam: BP 114/74   Pulse (!) 57   Ht 6\' 3"  (1.905 m)   Wt 180 lb (81.6 kg)   BMI 22.50 kg/m   Constitutional:  Alert and oriented, No acute distress. HEENT: [place] AT, moist mucus membranes.  Trachea midline, no masses. Cardiovascular: No clubbing, cyanosis, or edema. Respiratory: Normal respiratory effort, no  increased work of breathing. GU: Bilateral descended testicles, normal right testicular lie without particular tenderness or enlargement.  Normal epididymis.  Cremasteric reflex intact.  Left testicle also normal and slightly lower in scrotal sack (physiologic). no scrotal swelling or skin changes. Neurologic: Grossly intact, no focal deficits, moving all 4 extremities. Psychiatric: Normal mood and affect.  Laboratory Data:  Lab Results  Component Value Date   CREATININE 0.99 07/23/2020   Lab Results  Component Value Date   HGBA1C 5.6 10/23/2015    UA negative   Pertinent Imaging: LINICAL DATA:  Right testicular pain   EXAM: SCROTAL ULTRASOUND   DOPPLER ULTRASOUND OF THE TESTICLES   TECHNIQUE: Complete ultrasound examination of the testicles, epididymis, and other scrotal structures was performed. Color and spectral Doppler ultrasound were also utilized to evaluate blood flow to the testicles.   COMPARISON:  None.   FINDINGS: Right testicle   Measurements: 4.1 x 2.3 x 2.8 cm. No mass or microlithiasis visualized.   Left testicle   Measurements: 4.4 x 2.5 x 2.6 cm. No mass or microlithiasis visualized.   Right epididymis:  Normal in size and appearance.   Left epididymis:  Normal in size and appearance.   Hydrocele:  None visualized.   Varicocele:  None visualized.   Pulsed Doppler interrogation of both testes demonstrates slightly diminished arterial and venous Doppler flow on the right relative to the left.   IMPRESSION: 1. Normal size and ultrasound appearance of the testicles. 2. There is slightly diminished arterial and venous Doppler flow on the right relative to the left, concerning for partial or intermittent torsion in the setting of acutely referable pain.   Call report request was placed at the time of interpretation. Final communication will be documented.     Electronically Signed   By: Delanna Ahmadi M.D.   On: 05/28/2021 09:56    Scrotal ultrasound was personally reviewed.  Assessment & Plan:    Intermittent testicular torsion  -Clinical history as well as Doppler ultrasound concerning for probable intermittent torsion as a primary differential diagnosis, currently has restored blood flow and exam is fairly benign but suspect he is likely having intermittent torsion or sequela of torsion detorsion event earlier this morning  -Given that he does have intact blood flow albeit slightly diminished on ultrasound and his pain is relatively minimal at this time, we can consider doing an urgent but not emergent bilateral orchidopexy.  Advised that if his pain becomes severe again, would need to do this emergently.  - Counseled him for consideration of bilateral orchiopexy   - Discussed risk including infection/ bruising/ swelling/ testicular loss, benefits and post op care    He has been added on to the operating room for later today.  He will remain n.p.o.  Scripts are sent to pharmacy for postoperative care.  He will have a driver.  All questions were answered.  I,Kailey Littlejohn,acting as a Education administrator for Hollice Espy, MD.,have documented all relevant documentation on the behalf of Hollice Espy, MD,as directed by  Hollice Espy, MD while in the presence of Hollice Espy, MD.  I have reviewed the above documentation for accuracy and completeness, and I agree with the above.   Hollice Espy, MD   [facility] [address] [place], [place] [address] [phone]

## 2021-05-28 NOTE — H&P (View-Only) (Signed)
05/28/21 10:35 AM   Matthew Romero 22-Mar-1991 [id number]  Referring provider:  Birdie Sons, MD [address] [place],  [place] [address] Chief Complaint  Patient presents with   Testicle Pain     HPI: Matthew Romero is a 30 y.o.male who further evaluation of testicular pain.    He reports that he experineced sharp shooting pain in his lower right testicle which woke him up around 3 AM. He has not had anything like this before. He experienced nausea but no vomitting. His pain is waxing and waning.  Currently its not severe.  He tolerated the ultrasound fine but afterwards, started to have some dull right testicular pain not nearly as severe as the pain in the middle of the night.  Scrotal ultrasound today revealed slightly diminished arterial and venous Doppler flow on the right relative to the left, concerning for partial or intermittent torsion in the setting of acutely referable pain.   He ate a bagel at 8 AM this morning as well as a coffee with creamer around 10 AM.   PMH: Past Medical History:  Diagnosis Date   ADD (attention deficit disorder)    Dysplastic nevus 02/08/2020   Right post. deltoid. Moderate atypia, close to edge.    Dysplastic nevus 09/26/2020   DYSPLASTIC COMPOUND NEVUS WITH MODERATE ATYPIA, LIMITED MARGINS FREE on right upper back   Dysplastic nevus 02/11/2021   R infraclavicular, mild atypia   Esotropia of right eye 04/2017   Exercise-induced asthma    prn inhaler   Major depressive disorder    Sleep apnea    uses CPAP nightly    Surgical History: Past Surgical History:  Procedure Laterality Date   ELECTROCONVULSIVE THERAPY  08/04/2016; 08/06/2016; 08/08/2016; 08/11/2016; 08/13/2016; 08/15/2016; 08/18/2016; 08/20/2016; 08/22/2016   STRABISMUS SURGERY Bilateral 01/30/2017   Procedure: REPAIR STRABISMUS BILATERAL;  Surgeon: Everitt Amber, MD;  Location: [facility];  Service: Ophthalmology;  Laterality: Bilateral;    STRABISMUS SURGERY Right 06/05/2017   Procedure: REPAIR STRABISMUS RIGHT EYE;  Surgeon: Everitt Amber, MD;  Location: [facility];  Service: Ophthalmology;  Laterality: Right;    Home Medications:  Allergies as of 05/28/2021   No Known Allergies      Medication List        Accurate as of May 28, 2021 10:35 AM. If you have any questions, ask your nurse or doctor.          STOP taking these medications    rOPINIRole 2 MG tablet Commonly known as: Requip Stopped by: Hollice Espy, MD   traZODone 150 MG tablet Commonly known as: DESYREL Stopped by: Hollice Espy, MD       TAKE these medications    albuterol 108 (90 Base) MCG/ACT inhaler Commonly known as: VENTOLIN HFA Inhale 2 puffs into the lungs every 6 (six) hours as needed for wheezing or shortness of breath.   amphetamine-dextroamphetamine 20 MG 24 hr capsule Commonly known as: ADDERALL XR Take 40 mg by mouth daily.   amphetamine-dextroamphetamine 20 MG tablet Commonly known as: ADDERALL Take 20 mg by mouth daily.   FLUoxetine 40 MG capsule Commonly known as: PROZAC Take 80 mg by mouth daily.        Allergies: No Known Allergies  Family History: Family History  Problem Relation Age of Onset   ADD / ADHD Mother    Anxiety disorder Mother    Depression Father    ADD / ADHD Brother  Depression Brother    ADD / ADHD Brother    Alcohol abuse Cousin    Drug abuse Cousin     Social History:  reports that he has been smoking cigars. He has never used smokeless tobacco. He reports current alcohol use. He reports current drug use. Drug: Marijuana.   Physical Exam: BP 114/74   Pulse (!) 57   Ht 6\' 3"  (1.905 m)   Wt 180 lb (81.6 kg)   BMI 22.50 kg/m   Constitutional:  Alert and oriented, No acute distress. HEENT: [place] AT, moist mucus membranes.  Trachea midline, no masses. Cardiovascular: No clubbing, cyanosis, or edema. Respiratory: Normal respiratory effort, no  increased work of breathing. GU: Bilateral descended testicles, normal right testicular lie without particular tenderness or enlargement.  Normal epididymis.  Cremasteric reflex intact.  Left testicle also normal and slightly lower in scrotal sack (physiologic). no scrotal swelling or skin changes. Neurologic: Grossly intact, no focal deficits, moving all 4 extremities. Psychiatric: Normal mood and affect.  Laboratory Data:  Lab Results  Component Value Date   CREATININE 0.99 07/23/2020   Lab Results  Component Value Date   HGBA1C 5.6 10/23/2015    UA negative   Pertinent Imaging: LINICAL DATA:  Right testicular pain   EXAM: SCROTAL ULTRASOUND   DOPPLER ULTRASOUND OF THE TESTICLES   TECHNIQUE: Complete ultrasound examination of the testicles, epididymis, and other scrotal structures was performed. Color and spectral Doppler ultrasound were also utilized to evaluate blood flow to the testicles.   COMPARISON:  None.   FINDINGS: Right testicle   Measurements: 4.1 x 2.3 x 2.8 cm. No mass or microlithiasis visualized.   Left testicle   Measurements: 4.4 x 2.5 x 2.6 cm. No mass or microlithiasis visualized.   Right epididymis:  Normal in size and appearance.   Left epididymis:  Normal in size and appearance.   Hydrocele:  None visualized.   Varicocele:  None visualized.   Pulsed Doppler interrogation of both testes demonstrates slightly diminished arterial and venous Doppler flow on the right relative to the left.   IMPRESSION: 1. Normal size and ultrasound appearance of the testicles. 2. There is slightly diminished arterial and venous Doppler flow on the right relative to the left, concerning for partial or intermittent torsion in the setting of acutely referable pain.   Call report request was placed at the time of interpretation. Final communication will be documented.     Electronically Signed   By: Delanna Ahmadi M.D.   On: 05/28/2021 09:56    Scrotal ultrasound was personally reviewed.  Assessment & Plan:    Intermittent testicular torsion  -Clinical history as well as Doppler ultrasound concerning for probable intermittent torsion as a primary differential diagnosis, currently has restored blood flow and exam is fairly benign but suspect he is likely having intermittent torsion or sequela of torsion detorsion event earlier this morning  -Given that he does have intact blood flow albeit slightly diminished on ultrasound and his pain is relatively minimal at this time, we can consider doing an urgent but not emergent bilateral orchidopexy.  Advised that if his pain becomes severe again, would need to do this emergently.  - Counseled him for consideration of bilateral orchiopexy   - Discussed risk including infection/ bruising/ swelling/ testicular loss, benefits and post op care    He has been added on to the operating room for later today.  He will remain n.p.o.  Scripts are sent to pharmacy for postoperative care.  He will have a driver.  All questions were answered.  I,Kailey Littlejohn,acting as a Education administrator for Hollice Espy, MD.,have documented all relevant documentation on the behalf of Hollice Espy, MD,as directed by  Hollice Espy, MD while in the presence of Hollice Espy, MD.  I have reviewed the above documentation for accuracy and completeness, and I agree with the above.   Hollice Espy, MD   [facility] [address] [place], [place] [address] [phone]

## 2021-05-28 NOTE — Telephone Encounter (Signed)
Olivia Mackie with [facility] calling with [place] results. Dr. Laqueta Carina asking to speak to Dr. Caryn Section. Warm transfer to [place] in the practice.

## 2021-05-28 NOTE — Transfer of Care (Signed)
Immediate Anesthesia Transfer of Care Note  Patient: Matthew Romero  Procedure(s) Performed: ORCHIOPEXY ADULT (Bilateral)  Patient Location: PACU  Anesthesia Type:General  Level of Consciousness: drowsy  Airway & Oxygen Therapy: Patient Spontanous Breathing and Patient connected to face mask oxygen  Post-op Assessment: Report given to RN and Post -op Vital signs reviewed and stable  Post vital signs: Reviewed and stable  Last Vitals:  Vitals Value Taken Time  BP 112/71 05/28/21 1720  Temp 36.2 C 05/28/21 1720  Pulse 97 05/28/21 1724  Resp 16 05/28/21 1724  SpO2 100 % 05/28/21 1724  Vitals shown include unvalidated device data.  Last Pain:  Vitals:   05/28/21 1720  TempSrc:   PainSc: Asleep         Complications: No notable events documented.

## 2021-05-28 NOTE — Anesthesia Procedure Notes (Signed)
Procedure Name: LMA Insertion Date/Time: 05/28/2021 4:38 PM Performed by: Tollie Eth, CRNA Pre-anesthesia Checklist: Patient identified, Patient being monitored, Timeout performed, Emergency Drugs available and Suction available Patient Re-evaluated:Patient Re-evaluated prior to induction Oxygen Delivery Method: Circle system utilized Preoxygenation: Pre-oxygenation with 100% oxygen Induction Type: IV induction Ventilation: Mask ventilation without difficulty LMA: LMA inserted LMA Size: 4.0 Tube type: Oral Number of attempts: 1 Placement Confirmation: positive ETCO2 and breath sounds checked- equal and bilateral Tube secured with: Tape Dental Injury: Teeth and Oropharynx as per pre-operative assessment

## 2021-05-28 NOTE — Op Note (Signed)
Date of procedure: 05/28/21  Preoperative diagnosis:  Right intermittent testicular torsion  Postoperative diagnosis:  Same as above  Procedure: Bilateral orchidopexy  Surgeon: Hollice Espy, MD  Anesthesia: General  Complications: None  Intraoperative findings: No evidence of ischemia bilaterally.  Three-point fixation bilaterally.  EBL: minimal  Specimens: none  Drains: none  Indication: SAIVION GOETTEL III is a 30 y.o. patient with severe acute onset right testicular pain that awakened him from sleep this morning.  Some waxing and waning pain since then.  Status post ultrasound showed some decreased blood flow in the right compared to left concern for possible intermittent torsion.  Clinically his diagnosis was also consistent with this..  After reviewing the management options for treatment, he elected to proceed with the above surgical procedure(s). We have discussed the potential benefits and risks of the procedure, side effects of the proposed treatment, the likelihood of the patient achieving the goals of the procedure, and any potential problems that might occur during the procedure or recuperation. Informed consent has been obtained.  Description of procedure:  The patient was taken to the operating room and general anesthesia was induced.  The patient was placed in the supine lithotomy position, prepped and draped in the usual sterile fashion, and preoperative antibiotics were administered. A preoperative time-out was performed.   10 cc of local anesthetic was injected along the median raphae.  A #15 blade was used to make about a 4 cm incision along the raphae.  The incision was carried down to the dartos using Bovie electrocautery.  A septum was opened towards the right side and the right testicle was delivered.  Tunica vaginalis was opened and the testicle itself appeared to be well vascularized without surrounding fluid or concern for ischemia or torsion.  At this  point time, I performed a three-point fixation using 4-0 Prolene's at the lower pole left and right poles affixed to the dark toes at the appropriate anatomic location including to the septum medially.  Next, attention was turned to the left side.  The septum was opened on this side and the testicle was delivered.  Tunica vaginalis was also widely opened and this testicle also appeared normal.  Three-point fixation using the aforementioned technique was performed using 4-0 Prolene suture.  The testicles were then delivered back into their anatomic position after fixation.  To scrotum was relatively tight.  I was able to close due to his with a running 3-0 Vicryl suture close individual compartments as the septum was relatively tight.  Ultimately, the scrotum was closed nicely.  A series of interrupted 4-0 chromic sutures were used to additionally close the skin.  The patient was then cleaned and dried and additional Dermabond was applied.  Scrotal fluffs and a scrotal support device were applied and he was reversed of anesthesia and taken the PACU in stable condition.  Plan: He will be discharged with supportive care and pain meds as needed.  Instructions were reviewed both with the patient preoperatively as well as with his parents.  We will have him return in 4 weeks for a wound check.  Hollice Espy, M.D.

## 2021-05-28 NOTE — Discharge Instructions (Addendum)
You may shower tomorrow  Be sure to keep compression on your scrotum either by wearing a jockstrap or tight compressive underwear such as compression shorts.  Avoid prolonged standing for the first 2 weeks postop.  Avoid sexual activity for at least 2 weeks.  Sutures will dissolve on their own.  There is also glue, please just leave this in place and do not pick at it.  AMBULATORY SURGERY  DISCHARGE INSTRUCTIONS   The drugs that you were given will stay in your system until tomorrow so for the next 24 hours you should not:  Drive an automobile Make any legal decisions Drink any alcoholic beverage   You may resume regular meals tomorrow.  Today it is better to start with liquids and gradually work up to solid foods.  You may eat anything you prefer, but it is better to start with liquids, then soup and crackers, and gradually work up to solid foods.   Please notify your doctor immediately if you have any unusual bleeding, trouble breathing, redness and pain at the surgery site, drainage, fever, or pain not relieved by medication.    Additional Instructions:    Please contact your physician with any problems or Same Day Surgery at [phone], Monday through Friday 6 am to 4 pm, or [facility] number at [phone].

## 2021-05-28 NOTE — Progress Notes (Signed)
      Established patient visit   Patient: Matthew Romero   DOB: 24-Apr-1991   30 y.o. Male  MRN: [id number] Visit Date: 05/28/2021  Today's healthcare provider: Wilhemena Durie, MD   Chief Complaint  Patient presents with   Testicle Pain   Subjective    HPI  Patient is here concerning testicular pain.pain started this morning. Pain is in right testicle. No blood in urine. Patient states the pain woke him up around 3 in the morning and was severe at that time.  Pain has been waxing and waning since then, but has now eased up, rates about a 3 out of 10 at this time. Had no symptoms at all when he went to bed last night. Has not had any trauma or other injuries. No history of urological disorders. No dysuria or discharge.     Medications: Outpatient Medications Prior to Visit  Medication Sig   albuterol (PROVENTIL HFA;VENTOLIN HFA) 108 (90 Base) MCG/ACT inhaler Inhale 2 puffs into the lungs every 6 (six) hours as needed for wheezing or shortness of breath.   amphetamine-dextroamphetamine (ADDERALL XR) 20 MG 24 hr capsule Take 40 mg by mouth daily.   amphetamine-dextroamphetamine (ADDERALL) 20 MG tablet Take 20 mg by mouth daily.   FLUoxetine (PROZAC) 40 MG capsule Take 80 mg by mouth daily.   rOPINIRole (REQUIP) 2 MG tablet Take 1 tablet (2 mg total) by mouth at bedtime. (Patient not taking: Reported on 05/28/2021)   traZODone (DESYREL) 150 MG tablet Take 150 mg by mouth at bedtime. (Patient not taking: Reported on 05/28/2021)   No facility-administered medications prior to visit.    Review of Systems     Objective    BP 107/68 (BP Location: Right Arm, Patient Position: Sitting, Cuff Size: Large)   Pulse (!) 52   Temp 98.5 F (36.9 C) (Temporal)   Resp 16   SpO2 96%  {Show previous vital signs (optional):23777}  Physical Exam   Right testicle diffusely swollen, slightly swollen compared to left. About 1cm higher than left.   No results found for any visits on  05/28/21.  Assessment & Plan     1. Right testicular pain Acute onset in the middle of the night while sleeping, by history is concerning for torsion. Pain is much improved although not resolved by the time of his exam this morning.  Obtained stat ultrasound and urology agreed to see this morning following completion of ultrasound.   No follow-ups on file.      The entirety of the information documented in the History of Present Illness, Review of Systems and Physical Exam were personally obtained by me. Portions of this information were initially documented by the CMA and reviewed by me for thoroughness and accuracy.     [place] Cranford Mon, MD  [facility] [phone] (phone) [phone] (fax)  [place]

## 2021-05-28 NOTE — Telephone Encounter (Signed)
Kim with [facility] called in a stat U/S of scrotum for Dr. Caryn Section.  She also sent a secure chat to Dr. Lelon Huh and Dr. Erlene Quan and both of them have acknowledged the secure chat.  I sent this message to [facility] high priority and I called into the practice and spoke with Mickel Baas letting her know about the stat U/S report for Dr. Caryn Section. Reason for Disposition  Health Information question, no triage required and triager able to answer question    Stat U/S report for Dr. Caryn Section relayed to Mickel Baas in the office.  Answer Assessment - Initial Assessment Questions 1. REASON FOR CALL or QUESTION: "What is your reason for calling today?" or "How can I best help you?" or "What question do you have that I can help answer?"     Kim with [facility] calling in a stat Ultrasound result of the scotum for Dr. Caryn Section.  Protocols used: Information Only Call - No Triage-A-AH

## 2021-05-28 NOTE — Interval H&P Note (Signed)
History and Physical Interval Note:  05/28/2021 4:17 PM  Matthew Romero  has presented today for surgery, with the diagnosis of Intermittent torsion.  The various methods of treatment have been discussed with the patient and family. After consideration of risks, benefits and other options for treatment, the patient has consented to  bilateral orchidopexy as a surgical intervention.  The patient's history has been reviewed, patient examined, no change in status, stable for surgery.  I have reviewed the patient's chart and labs.  Questions were answered to the patient's satisfaction.    RRR CTAB  Hollice Espy

## 2021-05-29 ENCOUNTER — Encounter: Payer: Self-pay | Admitting: Urology

## 2021-06-04 NOTE — Anesthesia Postprocedure Evaluation (Signed)
Anesthesia Post Note  Patient: Matthew Romero  Procedure(s) Performed: ORCHIOPEXY ADULT (Bilateral)  Patient location during evaluation: PACU Anesthesia Type: General Level of consciousness: awake and alert Pain management: pain level controlled Vital Signs Assessment: post-procedure vital signs reviewed and stable Respiratory status: spontaneous breathing, nonlabored ventilation, respiratory function stable and patient connected to nasal cannula oxygen Cardiovascular status: blood pressure returned to baseline and stable Postop Assessment: no apparent nausea or vomiting Anesthetic complications: no   No notable events documented.   Last Vitals:  Vitals:   05/28/21 1827 05/28/21 1908  BP: (!) 132/93 121/86  Pulse: (!) 51 (!) 50  Resp: 14 16  Temp: (!) 36.1 C   SpO2: 100% 99%    Last Pain:  Vitals:   05/29/21 1032  TempSrc:   PainSc: 0-No pain                 Martha Clan

## 2021-06-12 ENCOUNTER — Telehealth: Payer: Self-pay

## 2021-06-12 NOTE — Telephone Encounter (Signed)
Patient called 2 weeks post Orchiopexy inquiring if he was able to resume his golfing activities. Spoke with Zara Council, PAC and patient was advised to hold off on this type of activity for another 2-3 weeks and discuss again at his post op follow up with Dr. Erlene Quan.

## 2021-07-02 NOTE — Progress Notes (Signed)
07/03/21 11:27 AM   Matthew Romero [birth year] [id number]  Referring provider:  Birdie Sons, MD [address] [place],  [place] [address] Chief Complaint  Patient presents with   Routine Post Op     HPI: Matthew Romero is a 31 y.o.male with a personal history of intermittent testicular torsion, who presents today for 1 month post-op wound check.   Scrotal ultrasound on 05/28/2021 revealed slightly diminished arterial and venous Doppler flow on the right relative to the left, concerning for partial or intermittent torsion in the setting of acutely referable pain.  He is s/p bilateral orchidopexy on 05/28/2021. Intraoperative findings showed no evidence of ischemia bilaterally and three-point fixation bilaterally.   He report that he experiences discomfort while he is sitting only for prolonged periods of time.He denies any pain or bruising.  Not had any recurrent severe testicular pain  PMH: Past Medical History:  Diagnosis Date   ADD (attention deficit disorder)    Dysplastic nevus 02/08/2020   Right post. deltoid. Moderate atypia, close to edge.    Dysplastic nevus 09/26/2020   DYSPLASTIC COMPOUND NEVUS WITH MODERATE ATYPIA, LIMITED MARGINS FREE on right upper back   Dysplastic nevus 02/11/2021   R infraclavicular, mild atypia   Esotropia of right eye 04/2017   Exercise-induced asthma    prn inhaler   Major depressive disorder    Sleep apnea    uses CPAP nightly    Surgical History: Past Surgical History:  Procedure Laterality Date   ELECTROCONVULSIVE THERAPY  08/04/2016; 08/06/2016; 08/08/2016; 08/11/2016; 08/13/2016; 08/15/2016; 08/18/2016; 08/20/2016; 08/22/2016   ORCHIOPEXY Bilateral 05/28/2021   Procedure: ORCHIOPEXY ADULT;  Surgeon: Hollice Espy, MD;  Location: [facility];  Service: Urology;  Laterality: Bilateral;   STRABISMUS SURGERY Bilateral 01/30/2017   Procedure: REPAIR STRABISMUS BILATERAL;  Surgeon: Everitt Amber, MD;  Location:  [place];  Service: Ophthalmology;  Laterality: Bilateral;   STRABISMUS SURGERY Right 06/05/2017   Procedure: REPAIR STRABISMUS RIGHT EYE;  Surgeon: Everitt Amber, MD;  Location: [place];  Service: Ophthalmology;  Laterality: Right;    Home Medications:  Allergies as of 07/03/2021   No Known Allergies      Medication List        Accurate as of July 03, 2021 11:27 AM. If you have any questions, ask your nurse or doctor.          STOP taking these medications    HYDROcodone-acetaminophen 5-325 MG tablet Commonly known as: NORCO/VICODIN Stopped by: Hollice Espy, MD       TAKE these medications    albuterol 108 (90 Base) MCG/ACT inhaler Commonly known as: VENTOLIN HFA Inhale 2 puffs into the lungs every 6 (six) hours as needed for wheezing or shortness of breath.   amphetamine-dextroamphetamine 20 MG tablet Commonly known as: ADDERALL Take 20 mg by mouth daily. What changed: Another medication with the same name was removed. Continue taking this medication, and follow the directions you see here. Changed by: Hollice Espy, MD   docusate sodium 100 MG capsule Commonly known as: COLACE Take 1 capsule (100 mg total) by mouth 2 (two) times daily.   FLUoxetine 40 MG capsule Commonly known as: PROZAC Take 80 mg by mouth daily.        Allergies: No Known Allergies  Family History: Family History  Problem Relation Age of Onset   ADD / ADHD Mother    Anxiety disorder Mother    Depression Father  ADD / ADHD Brother    Depression Brother    ADD / ADHD Brother    Alcohol abuse Cousin    Drug abuse Cousin     Social History:  reports that he has been smoking cigars. He has never used smokeless tobacco. He reports current alcohol use. He reports current drug use. Drug: Marijuana.   Physical Exam: BP 120/78    Pulse 80    Ht 6\' 3"  (1.905 m)    Wt 180 lb (81.6 kg)    BMI 22.50 kg/m   Constitutional:  Alert and oriented, No  acute distress. HEENT: [place] AT, moist mucus membranes.  Trachea midline, no masses. Cardiovascular: No clubbing, cyanosis, or edema. Respiratory: Normal respiratory effort, no increased work of breathing. Skin: No rashes, bruises or suspicious lesions. GU: bilateral descending testicles healthy no masses, significantly less mobile on exam, appears well healed  Neurologic: Grossly intact, no focal deficits, moving all 4 extremities. Psychiatric: Normal mood and affect.  Laboratory Data:  Lab Results  Component Value Date   CREATININE 0.99 07/23/2020   Lab Results  Component Value Date   HGBA1C 5.6 10/23/2015    Assessment & Plan:    Intermittent testicular torsion  - S/p bilateral orchidopexy on 05/28/2021 he is healing well  - Continue wearing compressive underwear - He is cleared for any physical activity   Follow-up as needed   I,Kailey Littlejohn,acting as a scribe for Hollice Espy, MD.,have documented all relevant documentation on the behalf of Hollice Espy, MD,as directed by  Hollice Espy, MD while in the presence of Hollice Espy, MD.  I have reviewed the above documentation for accuracy and completeness, and I agree with the above.   Hollice Espy, MD   [facility] [address] [place], [place] [address] [phone]

## 2021-07-03 ENCOUNTER — Encounter: Payer: Self-pay | Admitting: Urology

## 2021-07-03 ENCOUNTER — Other Ambulatory Visit: Payer: Self-pay

## 2021-07-03 ENCOUNTER — Ambulatory Visit: Payer: Managed Care, Other (non HMO) | Admitting: Urology

## 2021-07-03 VITALS — BP 120/78 | HR 80 | Ht 75.0 in | Wt 180.0 lb

## 2021-07-03 DIAGNOSIS — N50819 Testicular pain, unspecified: Secondary | ICD-10-CM

## 2022-02-10 ENCOUNTER — Ambulatory Visit: Payer: Managed Care, Other (non HMO) | Admitting: Dermatology

## 2022-02-10 DIAGNOSIS — D239 Other benign neoplasm of skin, unspecified: Secondary | ICD-10-CM

## 2022-02-10 DIAGNOSIS — D225 Melanocytic nevi of trunk: Secondary | ICD-10-CM | POA: Diagnosis not present

## 2022-02-10 DIAGNOSIS — D2221 Melanocytic nevi of right ear and external auricular canal: Secondary | ICD-10-CM | POA: Diagnosis not present

## 2022-02-10 DIAGNOSIS — L814 Other melanin hyperpigmentation: Secondary | ICD-10-CM

## 2022-02-10 DIAGNOSIS — Z1283 Encounter for screening for malignant neoplasm of skin: Secondary | ICD-10-CM | POA: Diagnosis not present

## 2022-02-10 DIAGNOSIS — D1800 Hemangioma unspecified site: Secondary | ICD-10-CM

## 2022-02-10 DIAGNOSIS — D2361 Other benign neoplasm of skin of right upper limb, including shoulder: Secondary | ICD-10-CM

## 2022-02-10 DIAGNOSIS — L578 Other skin changes due to chronic exposure to nonionizing radiation: Secondary | ICD-10-CM

## 2022-02-10 DIAGNOSIS — D229 Melanocytic nevi, unspecified: Secondary | ICD-10-CM

## 2022-02-10 DIAGNOSIS — L821 Other seborrheic keratosis: Secondary | ICD-10-CM

## 2022-02-10 NOTE — Progress Notes (Signed)
   Follow-Up Visit   Subjective  Matthew Romero is a 31 y.o. male who presents for the following: Annual Exam (Hx dysplastic nevi ). The patient presents for Total-Body Skin Exam (TBSE) for skin cancer screening and mole check.  The patient has spots, moles and lesions to be evaluated, some may be new or changing and the patient has concerns that these could be cancer.  The following portions of the chart were reviewed this encounter and updated as appropriate:   Tobacco  Allergies  Meds  Problems  Med Hx  Surg Hx  Fam Hx     Review of Systems:  No other skin or systemic complaints except as noted in HPI or Assessment and Plan.  Objective  Well appearing patient in no apparent distress; mood and affect are within normal limits.  A full examination was performed including scalp, head, eyes, ears, nose, lips, neck, chest, axillae, abdomen, back, buttocks, bilateral upper extremities, bilateral lower extremities, hands, feet, fingers, toes, fingernails, and toenails. All findings within normal limits unless otherwise noted below.  R mid to sup ear Multiple regular brown macules.  Supra pubic 0.9 x 0.7 cm regular brown macule  R sup med buttocks 0.8 x 0.7 cm regular brown macule.              Assessment & Plan  Nevus (3) R mid to sup ear; R sup med buttocks; Supra pubic  Stable compared to previous photos and measurements -  (See Dr Robbi Garter photos) Benign-appearing.  Observation.  Call clinic for new or changing moles.  Recommend daily use of broad spectrum spf 30+ sunscreen to sun-exposed areas.   Lentigines - Scattered tan macules - Due to sun exposure - Benign-appearing, observe - Recommend daily broad spectrum sunscreen SPF 30+ to sun-exposed areas, reapply every 2 hours as needed. - Call for any changes  Seborrheic Keratoses - Stuck-on, waxy, tan-brown papules and/or plaques  - Benign-appearing - Discussed benign etiology and prognosis. -  Observe - Call for any changes  Melanocytic Nevi - Tan-brown and/or pink-flesh-colored symmetric macules and papules - Benign appearing on exam today - Observation - Call clinic for new or changing moles - Recommend daily use of broad spectrum spf 30+ sunscreen to sun-exposed areas.   Hemangiomas - Red papules - Discussed benign nature - Observe - Call for any changes  Actinic Damage - Chronic condition, secondary to cumulative UV/sun exposure - diffuse scaly erythematous macules with underlying dyspigmentation - Recommend daily broad spectrum sunscreen SPF 30+ to sun-exposed areas, reapply every 2 hours as needed.  - Staying in the shade or wearing long sleeves, sun glasses (UVA+UVB protection) and wide brim hats (4-inch brim around the entire circumference of the hat) are also recommended for sun protection.  - Call for new or changing lesions.  Dermatofibroma - R tricep  - Firm pink/brown papulenodule with dimple sign - Benign appearing - Call for any changes  Skin cancer screening performed today.  Return in about 1 year (around 02/11/2023) for TBSE.  Luther Redo, CMA, am acting as scribe for Sarina Ser, MD . Documentation: I have reviewed the above documentation for accuracy and completeness, and I agree with the above.  Sarina Ser, MD

## 2022-02-10 NOTE — Patient Instructions (Signed)
Due to recent changes in healthcare laws, you may see results of your pathology and/or laboratory studies on MyChart before the doctors have had a chance to review them. We understand that in some cases there may be results that are confusing or concerning to you. Please understand that not all results are received at the same time and often the doctors may need to interpret multiple results in order to provide you with the best plan of care or course of treatment. Therefore, we ask that you please give us 2 business days to thoroughly review all your results before contacting the office for clarification. Should we see a critical lab result, you will be contacted sooner.   If You Need Anything After Your Visit  If you have any questions or concerns for your doctor, please call our main line at [phone] and press option 4 to reach your doctor's medical assistant. If no one answers, please leave a voicemail as directed and we will return your call as soon as possible. Messages left after 4 pm will be answered the following business day.   You may also send us a message via MyChart. We typically respond to MyChart messages within 1-2 business days.  For prescription refills, please ask your pharmacy to contact our office. Our fax number is [phone].  If you have an urgent issue when the clinic is closed that cannot wait until the next business day, you can page your doctor at the number below.    Please note that while we do our best to be available for urgent issues outside of office hours, we are not available 24/7.   If you have an urgent issue and are unable to reach us, you may choose to seek medical care at your doctor's office, retail clinic, urgent care center, or emergency room.  If you have a medical emergency, please immediately call 911 or go to the emergency department.  Pager Numbers  - Dr. Kowalski: [phone]  - Dr. Moye: [phone]  - Dr. Stewart:  [phone]  In the event of inclement weather, please call our main line at [phone] for an update on the status of any delays or closures.  Dermatology Medication Tips: Please keep the boxes that topical medications come in in order to help keep track of the instructions about where and how to use these. Pharmacies typically print the medication instructions only on the boxes and not directly on the medication tubes.   If your medication is too expensive, please contact our office at [phone] option 4 or send us a message through MyChart.   We are unable to tell what your co-pay for medications will be in advance as this is different depending on your insurance coverage. However, we may be able to find a substitute medication at lower cost or fill out paperwork to get insurance to cover a needed medication.   If a prior authorization is required to get your medication covered by your insurance company, please allow us 1-2 business days to complete this process.  Drug prices often vary depending on where the prescription is filled and some pharmacies may offer cheaper prices.  The website www.goodrx.com contains coupons for medications through different pharmacies. The prices here do not account for what the cost may be with help from insurance (it may be cheaper with your insurance), but the website can give you the price if you did not use any insurance.  - You can print the associated coupon and take it with   your prescription to the pharmacy.  - You may also stop by our office during regular business hours and pick up a GoodRx coupon card.  - If you need your prescription sent electronically to a different pharmacy, notify our office through [place] MyChart or by phone at [phone] option 4.     Si Usted Necesita Algo Despus de Su Visita  Tambin puede enviarnos un mensaje a travs de MyChart. Por lo general respondemos a los mensajes de MyChart en el transcurso de 1 a 2  das hbiles.  Para renovar recetas, por favor pida a su farmacia que se ponga en contacto con nuestra oficina. Nuestro nmero de fax es el [phone].  Si tiene un asunto urgente cuando la clnica est cerrada y que no puede esperar hasta el siguiente da hbil, puede llamar/localizar a su doctor(a) al nmero que aparece a continuacin.   Por favor, tenga en cuenta que aunque hacemos todo lo posible para estar disponibles para asuntos urgentes fuera del horario de oficina, no estamos disponibles las 24 horas del da, los 7 das de la semana.   Si tiene un problema urgente y no puede comunicarse con nosotros, puede optar por buscar atencin mdica  en el consultorio de su doctor(a), en una clnica privada, en un centro de atencin urgente o en una sala de emergencias.  Si tiene una emergencia mdica, por favor llame inmediatamente al 911 o vaya a la sala de emergencias.  Nmeros de bper  - Dr. Kowalski: [phone]  - Dra. Moye: [phone]  - Dra. Stewart: [phone]  En caso de inclemencias del tiempo, por favor llame a nuestra lnea principal al [phone] para una actualizacin sobre el estado de cualquier retraso o cierre.  Consejos para la medicacin en dermatologa: Por favor, guarde las cajas en las que vienen los medicamentos de uso tpico para ayudarle a seguir las instrucciones sobre dnde y cmo usarlos. Las farmacias generalmente imprimen las instrucciones del medicamento slo en las cajas y no directamente en los tubos del medicamento.   Si su medicamento es muy caro, por favor, pngase en contacto con nuestra oficina llamando al [phone] y presione la opcin 4 o envenos un mensaje a travs de MyChart.   No podemos decirle cul ser su copago por los medicamentos por adelantado ya que esto es diferente dependiendo de la cobertura de su seguro. Sin embargo, es posible que podamos encontrar un medicamento sustituto a menor costo o llenar un formulario para que el  seguro cubra el medicamento que se considera necesario.   Si se requiere una autorizacin previa para que su compaa de seguros cubra su medicamento, por favor permtanos de 1 a 2 das hbiles para completar este proceso.  Los precios de los medicamentos varan con frecuencia dependiendo del lugar de dnde se surte la receta y alguna farmacias pueden ofrecer precios ms baratos.  El sitio web www.goodrx.com tiene cupones para medicamentos de diferentes farmacias. Los precios aqu no tienen en cuenta lo que podra costar con la ayuda del seguro (puede ser ms barato con su seguro), pero el sitio web puede darle el precio si no utiliz ningn seguro.  - Puede imprimir el cupn correspondiente y llevarlo con su receta a la farmacia.  - Tambin puede pasar por nuestra oficina durante el horario de atencin regular y recoger una tarjeta de cupones de GoodRx.  - Si necesita que su receta se enve electrnicamente a una farmacia diferente, informe a nuestra oficina a travs de MyChart de [place]   o por telfono llamando al [phone] y presione la opcin 4.  

## 2022-02-17 ENCOUNTER — Encounter: Payer: Self-pay | Admitting: Dermatology

## 2023-03-05 ENCOUNTER — Ambulatory Visit: Payer: Managed Care, Other (non HMO) | Admitting: Dermatology

## 2023-03-05 DIAGNOSIS — Z1283 Encounter for screening for malignant neoplasm of skin: Secondary | ICD-10-CM

## 2023-03-05 DIAGNOSIS — D2221 Melanocytic nevi of right ear and external auricular canal: Secondary | ICD-10-CM

## 2023-03-05 DIAGNOSIS — D489 Neoplasm of uncertain behavior, unspecified: Secondary | ICD-10-CM

## 2023-03-05 DIAGNOSIS — D2361 Other benign neoplasm of skin of right upper limb, including shoulder: Secondary | ICD-10-CM

## 2023-03-05 DIAGNOSIS — W908XXA Exposure to other nonionizing radiation, initial encounter: Secondary | ICD-10-CM | POA: Diagnosis not present

## 2023-03-05 DIAGNOSIS — L821 Other seborrheic keratosis: Secondary | ICD-10-CM

## 2023-03-05 DIAGNOSIS — L578 Other skin changes due to chronic exposure to nonionizing radiation: Secondary | ICD-10-CM

## 2023-03-05 DIAGNOSIS — D239 Other benign neoplasm of skin, unspecified: Secondary | ICD-10-CM

## 2023-03-05 DIAGNOSIS — L814 Other melanin hyperpigmentation: Secondary | ICD-10-CM

## 2023-03-05 DIAGNOSIS — D225 Melanocytic nevi of trunk: Secondary | ICD-10-CM

## 2023-03-05 DIAGNOSIS — D224 Melanocytic nevi of scalp and neck: Secondary | ICD-10-CM

## 2023-03-05 DIAGNOSIS — D229 Melanocytic nevi, unspecified: Secondary | ICD-10-CM

## 2023-03-05 DIAGNOSIS — Z86018 Personal history of other benign neoplasm: Secondary | ICD-10-CM

## 2023-03-05 NOTE — Patient Instructions (Addendum)
Biopsy Wound Care Instructions  Leave the original bandage on for 24 hours if possible.  If the bandage becomes soaked or soiled before that time, it is OK to remove it and examine the wound.  A small amount of post-operative bleeding is normal.  If excessive bleeding occurs, remove the bandage, place gauze over the site and apply continuous pressure (no peeking) over the area for 30 minutes. If this does not work, please call our clinic as soon as possible or page your doctor if it is after hours.   Once a day, cleanse the wound with soap and water. It is fine to shower. If a thick crust develops you may use a Q-tip dipped into dilute hydrogen peroxide (mix 1:1 with water) to dissolve it.  Hydrogen peroxide can slow the healing process, so use it only as needed.    After washing, apply petroleum jelly (Vaseline) or an antibiotic ointment if your doctor prescribed one for you, followed by a bandage.    For best healing, the wound should be covered with a layer of ointment at all times. If you are not able to keep the area covered with a bandage to hold the ointment in place, this may mean re-applying the ointment several times a day.  Continue this wound care until the wound has healed and is no longer open.   Itching and mild discomfort is normal during the healing process. However, if you develop pain or severe itching, please call our office.   If you have any discomfort, you can take Tylenol (acetaminophen) or ibuprofen as directed on the bottle. (Please do not take these if you have an allergy to them or cannot take them for another reason).  Some redness, tenderness and white or yellow material in the wound is normal healing.  If the area becomes very sore and red, or develops a thick yellow-green material (pus), it may be infected; please notify us.    If you have stitches, return to clinic as directed to have the stitches removed. You will continue wound care for 2-3 days after the stitches  are removed.   Wound healing continues for up to one year following surgery. It is not unusual to experience pain in the scar from time to time during the interval.  If the pain becomes severe or the scar thickens, you should notify the office.    A slight amount of redness in a scar is expected for the first six months.  After six months, the redness will fade and the scar will soften and fade.  The color difference becomes less noticeable with time.  If there are any problems, return for a post-op surgery check at your earliest convenience.  To improve the appearance of the scar, you can use silicone scar gel, cream, or sheets (such as Mederma or Serica) every night for up to one year. These are available over the counter (without a prescription).  Please call our office at [phone] for any questions or concerns.       Melanoma ABCDEs  Melanoma is the most dangerous type of skin cancer, and is the leading cause of death from skin disease.  You are more likely to develop melanoma if you: Have light-colored skin, light-colored eyes, or red or blond hair Spend a lot of time in the sun Tan regularly, either outdoors or in a tanning bed Have had blistering sunburns, especially during childhood Have a close family member who has had a melanoma Have atypical moles  or large birthmarks  Early detection of melanoma is key since treatment is typically straightforward and cure rates are extremely high if we catch it early.   The first sign of melanoma is often a change in a mole or a new dark spot.  The ABCDE system is a way of remembering the signs of melanoma.  A for asymmetry:  The two halves do not match. B for border:  The edges of the growth are irregular. C for color:  A mixture of colors are present instead of an even brown color. D for diameter:  Melanomas are usually (but not always) greater than 6mm - the size of a pencil eraser. E for evolution:  The spot keeps changing in  size, shape, and color.  Please check your skin once per month between visits. You can use a small mirror in front and a large mirror behind you to keep an eye on the back side or your body.   If you see any new or changing lesions before your next follow-up, please call to schedule a visit.  Please continue daily skin protection including broad spectrum sunscreen SPF 30+ to sun-exposed areas, reapplying every 2 hours as needed when you're outdoors.   Staying in the shade or wearing long sleeves, sun glasses (UVA+UVB protection) and wide brim hats (4-inch brim around the entire circumference of the hat) are also recommended for sun protection.    Due to recent changes in healthcare laws, you may see results of your pathology and/or laboratory studies on MyChart before the doctors have had a chance to review them. We understand that in some cases there may be results that are confusing or concerning to you. Please understand that not all results are received at the same time and often the doctors may need to interpret multiple results in order to provide you with the best plan of care or course of treatment. Therefore, we ask that you please give [place] 2 business days to thoroughly review all your results before contacting the office for clarification. Should we see a critical lab result, you will be contacted sooner.   If You Need Anything After Your Visit  If you have any questions or concerns for your doctor, please call our main line at [phone] and press option 4 to reach your doctor's medical assistant. If no one answers, please leave a voicemail as directed and we will return your call as soon as possible. Messages left after 4 pm will be answered the following business day.   You may also send [place] a message via MyChart. We typically respond to MyChart messages within 1-2 business days.  For prescription refills, please ask your pharmacy to contact our office. Our fax number is  [phone].  If you have an urgent issue when the clinic is closed that cannot wait until the next business day, you can page your doctor at the number below.    Please note that while we do our best to be available for urgent issues outside of office hours, we are not available 24/7.   If you have an urgent issue and are unable to reach [place], you may choose to seek medical care at your doctor's office, retail clinic, urgent care center, or emergency room.  If you have a medical emergency, please immediately call 911 or go to the emergency department.  Pager Numbers  - Dr. Gwen Pounds: [phone]  - Dr. Roseanne Reno: [phone]  - Dr. Katrinka Blazing: [phone]   In the event of  inclement weather, please call our main line at [phone] for an update on the status of any delays or closures.  Dermatology Medication Tips: Please keep the boxes that topical medications come in in order to help keep track of the instructions about where and how to use these. Pharmacies typically print the medication instructions only on the boxes and not directly on the medication tubes.   If your medication is too expensive, please contact our office at [phone] option 4 or send [place] a message through MyChart.   We are unable to tell what your co-pay for medications will be in advance as this is different depending on your insurance coverage. However, we may be able to find a substitute medication at lower cost or fill out paperwork to get insurance to cover a needed medication.   If a prior authorization is required to get your medication covered by your insurance company, please allow [place] 1-2 business days to complete this process.  Drug prices often vary depending on where the prescription is filled and some pharmacies may offer cheaper prices.  The website www.goodrx.com contains coupons for medications through different pharmacies. The prices here do not account for what the cost may be with help from  insurance (it may be cheaper with your insurance), but the website can give you the price if you did not use any insurance.  - You can print the associated coupon and take it with your prescription to the pharmacy.  - You may also stop by our office during regular business hours and pick up a GoodRx coupon card.  - If you need your prescription sent electronically to a different pharmacy, notify our office through [facility] or by phone at [phone] option 4.     Si Usted Necesita Algo Despus de Su Visita  Tambin puede enviarnos un mensaje a travs de Clinical cytogeneticist. Por lo general respondemos a los mensajes de MyChart en el transcurso de 1 a 2 das hbiles.  Para renovar recetas, por favor pida a su farmacia que se ponga en contacto con nuestra oficina. Annie Sable de fax es [place] [phone].  Si tiene un asunto urgente cuando la clnica est cerrada y que no puede esperar hasta el siguiente da hbil, puede llamar/localizar a su doctor(a) al nmero que aparece a continuacin.   Por favor, tenga en cuenta que aunque hacemos todo lo posible para estar disponibles para asuntos urgentes fuera del horario de [place], no estamos disponibles las 24 horas del da, los 7 [address]  [address] de la [place].   Si tiene un problema urgente y no puede comunicarse con nosotros, puede optar por buscar atencin mdica  en el consultorio de su doctor(a), en una clnica privada, en un centro de atencin urgente o en una sala de emergencias.  Si tiene Engineer, drilling, por favor llame inmediatamente al 911 o vaya a la sala de emergencias.  Nmeros de bper  - Dr. Gwen Pounds: [phone]  - Dra. Roseanne Reno: [phone]  - Dr. Katrinka Blazing: [phone]   En caso de inclemencias del tiempo, por favor llame a Lacy Duverney principal al [phone] para una actualizacin sobre el [place] de cualquier retraso o cierre.  Consejos para la medicacin en dermatologa: Por favor, guarde las cajas en las que vienen los  medicamentos de uso tpico para ayudarle a seguir las instrucciones sobre dnde y cmo usarlos. Las farmacias generalmente imprimen las instrucciones del medicamento slo en las cajas y no directamente en los tubos del [place].   Si su medicamento  es muy caro, por favor, pngase en contacto con Rolm Gala llamando al [phone] y presione la opcin 4 o envenos un mensaje a travs de Clinical cytogeneticist.   No podemos decirle cul ser su copago por los medicamentos por adelantado ya que esto es diferente dependiendo de la cobertura de su seguro. Sin embargo, es posible que podamos encontrar un medicamento sustituto a Audiological scientist un formulario para que el seguro cubra el medicamento que se considera necesario.   Si se requiere una autorizacin previa para que su compaa de seguros [place] su medicamento, por favor permtanos de 1 a 2 das hbiles para completar [address]  Los precios de los medicamentos varan con frecuencia dependiendo del Environmental consultant de dnde se surte la receta y alguna farmacias pueden ofrecer precios ms baratos.  El sitio web www.goodrx.com tiene cupones para medicamentos de Health and safety inspector. Los precios aqu no tienen en cuenta lo que podra costar con la ayuda del seguro (puede ser ms barato con su seguro), pero el sitio web puede darle el precio si no utiliz Tourist information centre manager.  - Puede imprimir el cupn correspondiente y llevarlo con su receta a la farmacia.  - Tambin puede pasar por nuestra oficina durante el horario de atencin regular y Education officer, museum una tarjeta de cupones de GoodRx.  - Si necesita que su receta se enve electrnicamente a una farmacia diferente, informe a nuestra oficina a travs de MyChart de [place] o por telfono llamando al [phone] y presione la opcin 4.

## 2023-03-05 NOTE — Progress Notes (Signed)
Follow-Up Visit   Subjective  Matthew Romero is a 32 y.o. male who presents for the following: Skin Cancer Screening and Full Body Skin Exam hx of dysplastic, hx of dermatofibroma The patient presents for Total-Body Skin Exam (TBSE) for skin cancer screening and mole check. The patient has spots, moles and lesions to be evaluated, some may be new or changing and the patient may have concern these could be cancer.  The following portions of the chart were reviewed this encounter and updated as appropriate: medications, allergies, medical history  Review of Systems:  No other skin or systemic complaints except as noted in HPI or Assessment and Plan.  Objective  Well appearing patient in no apparent distress; mood and affect are within normal limits.  A full examination was performed including scalp, head, eyes, ears, nose, lips, neck, chest, axillae, abdomen, back, buttocks, bilateral upper extremities, bilateral lower extremities, hands, feet, fingers, toes, fingernails, and toenails. All findings within normal limits unless otherwise noted below.   Relevant physical exam findings are noted in the Assessment and Plan.  left upper quadrant perumbilical 0.6 cm irregular brown macule     left lower quadrant perumbilical 0.5 cm irregular brown macule      right superior medial buttocks 0.8 x 0.7 cm irregular brown macule             Assessment & Plan   Dermatofibroma - R tricep  - Firm pink/brown papulenodule with dimple sign - Benign appearing - Call for any changes  SKIN CANCER SCREENING PERFORMED TODAY.  ACTINIC DAMAGE - Chronic condition, secondary to cumulative UV/sun exposure - diffuse scaly erythematous macules with underlying dyspigmentation - Recommend daily broad spectrum sunscreen SPF 30+ to sun-exposed areas, reapply every 2 hours as needed.  - Staying in the shade or wearing long sleeves, sun glasses (UVA+UVB protection) and wide brim hats  (4-inch brim around the entire circumference of the hat) are also recommended for sun protection.  - Call for new or changing lesions.  LENTIGINES, SEBORRHEIC KERATOSES, HEMANGIOMAS - Benign normal skin lesions - Benign-appearing - Call for any changes  MELANOCYTIC NEVI - Tan-brown and/or pink-flesh-colored symmetric macules and papules - Benign appearing on exam today - Observation - Call clinic for new or changing moles - Recommend daily use of broad spectrum spf 30+ sunscreen to sun-exposed areas.   Nevus  Right scalpular postauricular regular brown macule Benign. Observe  R mid to sup ear See photo above Multiple regular brown macules. Benign. Observe    Supra pubic 0.9 x 0.7 cm regular brown macule  May consider removing in future.  Benign-appearing. Stable compared to previous visit. Observation.  Call clinic for new or changing moles.  Recommend daily use of broad spectrum spf 30+ sunscreen to sun-exposed areas.    HISTORY OF DYSPLASTIC NEVUS See history  No evidence of recurrence today Recommend regular full body skin exams Recommend daily broad spectrum sunscreen SPF 30+ to sun-exposed areas, reapply every 2 hours as needed.  Call if any new or changing lesions are noted between office visits  Neoplasm of uncertain behavior (3) left upper quadrant perumbilical  Epidermal / dermal shaving  Lesion diameter (cm):  0.6 Informed consent: discussed and consent obtained   Timeout: patient name, date of birth, surgical site, and procedure verified   Procedure prep:  Patient was prepped and draped in usual sterile fashion Prep type:  Isopropyl alcohol Anesthesia: the lesion was anesthetized in a standard fashion   Anesthetic:  1%  lidocaine w/ epinephrine 1-100,000 buffered w/ 8.4% NaHCO3 Instrument used: flexible razor blade   Hemostasis achieved with: pressure, aluminum chloride and electrodesiccation   Outcome: patient tolerated procedure well   Post-procedure  details: sterile dressing applied and wound care instructions given   Dressing type: bandage and petrolatum    Specimen 1 - Surgical pathology Differential Diagnosis: Irregular nevus r/o dyplasia   Check Margins: No  left lower quadrant perumbilical  Epidermal / dermal shaving  Lesion diameter (cm):  0.5 Informed consent: discussed and consent obtained   Timeout: patient name, date of birth, surgical site, and procedure verified   Procedure prep:  Patient was prepped and draped in usual sterile fashion Prep type:  Isopropyl alcohol Anesthesia: the lesion was anesthetized in a standard fashion   Anesthetic:  1% lidocaine w/ epinephrine 1-100,000 buffered w/ 8.4% NaHCO3 Instrument used: flexible razor blade   Hemostasis achieved with: pressure, aluminum chloride and electrodesiccation   Outcome: patient tolerated procedure well   Post-procedure details: sterile dressing applied and wound care instructions given   Dressing type: bandage and petrolatum    Specimen 2 - Surgical pathology Differential Diagnosis: Irregular nevus r/o dyplasia   Check Margins: No  right superior medial buttocks  Epidermal / dermal shaving  Lesion diameter (cm):  0.8 Informed consent: discussed and consent obtained   Timeout: patient name, date of birth, surgical site, and procedure verified   Procedure prep:  Patient was prepped and draped in usual sterile fashion Prep type:  Isopropyl alcohol Anesthesia: the lesion was anesthetized in a standard fashion   Anesthetic:  1% lidocaine w/ epinephrine 1-100,000 buffered w/ 8.4% NaHCO3 Instrument used: flexible razor blade   Hemostasis achieved with: pressure, aluminum chloride and electrodesiccation   Outcome: patient tolerated procedure well   Post-procedure details: sterile dressing applied and wound care instructions given   Dressing type: bandage and petrolatum    Specimen 3 - Surgical pathology Differential Diagnosis: Irregular nevus r/o  dyplasia   Check Margins: No  Irregular nevus r/o dyplasia      Return in about 1 year (around 03/04/2024) for TBSE.  IAsher Muir, CMA, am acting as scribe for Armida Sans, MD.   Documentation: I have reviewed the above documentation for accuracy and completeness, and I agree with the above.  Armida Sans, MD

## 2023-03-08 ENCOUNTER — Encounter: Payer: Self-pay | Admitting: Dermatology

## 2023-03-10 ENCOUNTER — Telehealth: Payer: Self-pay

## 2023-03-10 NOTE — Telephone Encounter (Signed)
Advised patient of results and scheduled 6 month follow up/hd

## 2023-03-10 NOTE — Telephone Encounter (Signed)
-----   Message from Armida Sans sent at 03/10/2023  9:04 AM EDT ----- Diagnosis 1. Skin , left upper quadrant periumbilical DYSPLASTIC COMPOUND NEVUS WITH MODERATE ATYPIA, CLOSE TO MARGIN 2. Skin , left lower quadrant periumbilical DYSPLASTIC COMPOUND NEVUS WITH MODERATE ATYPIA, PERIPHERAL AND DEEP MARGINS INVOLVED 3. Skin , right superior medial buttocks DYSPLASTIC COMPOUND NEVUS WITH MODERATE ATYPIA, CLOSE TO MARGIN  1,2,3 = all three Moderate dysplastic Recheck next visit In light of these, we should remove the suprapubic mole too. May schedule for this in next 6 mos

## 2023-09-17 ENCOUNTER — Ambulatory Visit: Payer: Managed Care, Other (non HMO) | Admitting: Dermatology

## 2023-12-14 ENCOUNTER — Ambulatory Visit: Admitting: Dermatology

## 2023-12-14 ENCOUNTER — Encounter: Payer: Self-pay | Admitting: Dermatology

## 2023-12-14 DIAGNOSIS — L578 Other skin changes due to chronic exposure to nonionizing radiation: Secondary | ICD-10-CM | POA: Diagnosis not present

## 2023-12-14 DIAGNOSIS — W908XXA Exposure to other nonionizing radiation, initial encounter: Secondary | ICD-10-CM

## 2023-12-14 DIAGNOSIS — D2262 Melanocytic nevi of left upper limb, including shoulder: Secondary | ICD-10-CM

## 2023-12-14 DIAGNOSIS — Z86018 Personal history of other benign neoplasm: Secondary | ICD-10-CM

## 2023-12-14 DIAGNOSIS — D229 Melanocytic nevi, unspecified: Secondary | ICD-10-CM

## 2023-12-14 DIAGNOSIS — L821 Other seborrheic keratosis: Secondary | ICD-10-CM

## 2023-12-14 DIAGNOSIS — D492 Neoplasm of unspecified behavior of bone, soft tissue, and skin: Secondary | ICD-10-CM

## 2023-12-14 DIAGNOSIS — Z7189 Other specified counseling: Secondary | ICD-10-CM

## 2023-12-14 DIAGNOSIS — Z79899 Other long term (current) drug therapy: Secondary | ICD-10-CM

## 2023-12-14 DIAGNOSIS — L649 Androgenic alopecia, unspecified: Secondary | ICD-10-CM

## 2023-12-14 DIAGNOSIS — L814 Other melanin hyperpigmentation: Secondary | ICD-10-CM

## 2023-12-14 DIAGNOSIS — D225 Melanocytic nevi of trunk: Secondary | ICD-10-CM

## 2023-12-14 DIAGNOSIS — Z1283 Encounter for screening for malignant neoplasm of skin: Secondary | ICD-10-CM | POA: Diagnosis not present

## 2023-12-14 DIAGNOSIS — L219 Seborrheic dermatitis, unspecified: Secondary | ICD-10-CM

## 2023-12-14 MED ORDER — KETOCONAZOLE 2 % EX SHAM: MEDICATED_SHAMPOO | CUTANEOUS | 11 refills | Status: AC

## 2023-12-14 MED ORDER — HYDROCORTISONE 2.5 % EX CREA: TOPICAL_CREAM | CUTANEOUS | 11 refills | Status: AC

## 2023-12-14 NOTE — Patient Instructions (Addendum)
 Wound Care Instructions  Cleanse wound gently with soap and water once a day then pat dry with clean gauze. Apply a thin coat of Petrolatum (petroleum jelly, Vaseline) over the wound (unless you have an allergy to this). We recommend that you use a new, sterile tube of Vaseline. Do not pick or remove scabs. Do not remove the yellow or white healing tissue from the base of the wound.  Cover the wound with fresh, clean, nonstick gauze and secure with paper tape. You may use Band-Aids in place of gauze and tape if the wound is small enough, but would recommend trimming much of the tape off as there is often too much. Sometimes Band-Aids can irritate the skin.  You should call the office for your biopsy report after 1 week if you have not already been contacted.  If you experience any problems, such as abnormal amounts of bleeding, swelling, significant bruising, significant pain, or evidence of infection, please call the office immediately.  FOR ADULT SURGERY PATIENTS: If you need something for pain relief you may take 1 extra strength Tylenol  (acetaminophen ) AND 2 Ibuprofen (200mg  each) together every 4 hours as needed for pain. (do not take these if you are allergic to them or if you have a reason you should not take them.) Typically, you may only need pain medication for 1 to 3 days.   Start Ketoconazole shampoo 2-3 times a week lather on scalp and face, leave on 5-8 minutes, rinse out.  Hydrocortisone 2.5% cream apply at bedtime M,W F as needed for redness/scale on face   Recommend daily broad spectrum sunscreen SPF 30+ to sun-exposed areas, reapply every 2 hours as needed. Call for new or changing lesions.  Staying in the shade or wearing long sleeves, sun glasses (UVA+UVB protection) and wide brim hats (4-inch brim around the entire circumference of the hat) are also recommended for sun protection.    Melanoma ABCDEs  Melanoma is the most dangerous type of skin cancer, and is the  leading cause of death from skin disease.  You are more likely to develop melanoma if you: Have light-colored skin, light-colored eyes, or red or blond hair Spend a lot of time in the sun Tan regularly, either outdoors or in a tanning bed Have had blistering sunburns, especially during childhood Have a close family member who has had a melanoma Have atypical moles or large birthmarks  Early detection of melanoma is key since treatment is typically straightforward and cure rates are extremely high if we catch it early.   The first sign of melanoma is often a change in a mole or a new dark spot.  The ABCDE system is a way of remembering the signs of melanoma.  A for asymmetry:  The two halves do not match. B for border:  The edges of the growth are irregular. C for color:  A mixture of colors are present instead of an even brown color. D for diameter:  Melanomas are usually (but not always) greater than 6mm - the size of a pencil eraser. E for evolution:  The spot keeps changing in size, shape, and color.  Please check your skin once per month between visits. You can use a small mirror in front and a large mirror behind you to keep an eye on the back side or your body.   If you see any new or changing lesions before your next follow-up, please call to schedule a visit.  Please continue daily skin protection including broad  spectrum sunscreen SPF 30+ to sun-exposed areas, reapplying every 2 hours as needed when you're outdoors.   Staying in the shade or wearing long sleeves, sun glasses (UVA+UVB protection) and wide brim hats (4-inch brim around the entire circumference of the hat) are also recommended for sun protection.      Due to recent changes in healthcare laws, you may see results of your pathology and/or laboratory studies on MyChart before the doctors have had a chance to review them. We understand that in some cases there may be results that are confusing or concerning to you.  Please understand that not all results are received at the same time and often the doctors may need to interpret multiple results in order to provide you with the best plan of care or course of treatment. Therefore, we ask that you please give us  2 business days to thoroughly review all your results before contacting the office for clarification. Should we see a critical lab result, you will be contacted sooner.   If You Need Anything After Your Visit  If you have any questions or concerns for your doctor, please call our main line at [phone] and press option 4 to reach your doctor's medical assistant. If no one answers, please leave a voicemail as directed and we will return your call as soon as possible. Messages left after 4 pm will be answered the following business day.   You may also send us  a message via MyChart. We typically respond to MyChart messages within 1-2 business days.  For prescription refills, please ask your pharmacy to contact our office. Our fax number is [phone].  If you have an urgent issue when the clinic is closed that cannot wait until the next business day, you can page your doctor at the number below.    Please note that while we do our best to be available for urgent issues outside of office hours, we are not available 24/7.   If you have an urgent issue and are unable to reach us , you may choose to seek medical care at your doctor's office, retail clinic, urgent care center, or emergency room.  If you have a medical emergency, please immediately call 911 or go to the emergency department.  Pager Numbers  - Dr. Bary Likes: [phone]  - Dr. Annette Barters: [phone]  - Dr. Felipe Horton: [phone]   In the event of inclement weather, please call our main line at [phone] for an update on the status of any delays or closures.  Dermatology Medication Tips: Please keep the boxes that topical medications come in in order to help keep track of the  instructions about where and how to use these. Pharmacies typically print the medication instructions only on the boxes and not directly on the medication tubes.   If your medication is too expensive, please contact our office at [phone] option 4 or send us  a message through MyChart.   We are unable to tell what your co-pay for medications will be in advance as this is different depending on your insurance coverage. However, we may be able to find a substitute medication at lower cost or fill out paperwork to get insurance to cover a needed medication.   If a prior authorization is required to get your medication covered by your insurance company, please allow us  1-2 business days to complete this process.  Drug prices often vary depending on where the prescription is filled and some pharmacies may offer cheaper prices.  The website www.goodrx.com contains  coupons for medications through different pharmacies. The prices here do not account for what the cost may be with help from insurance (it may be cheaper with your insurance), but the website can give you the price if you did not use any insurance.  - You can print the associated coupon and take it with your prescription to the pharmacy.  - You may also stop by our office during regular business hours and pick up a GoodRx coupon card.  - If you need your prescription sent electronically to a different pharmacy, notify our office through [facility] or by phone at [phone] option 4.     Si Usted Necesita Algo Despus de Su Visita  Tambin puede enviarnos un mensaje a travs de Clinical cytogeneticist. Por lo general respondemos a los mensajes de MyChart en el transcurso de 1 a 2 das hbiles.  Para renovar recetas, por favor pida a su farmacia que se ponga en contacto con nuestra oficina. Franz Jacks de fax es [place] [phone].  Si tiene un asunto urgente cuando la clnica est cerrada y que no puede esperar hasta el siguiente da hbil,  puede llamar/localizar a su doctor(a) al nmero que aparece a continuacin.   Por favor, tenga en cuenta que aunque hacemos todo lo posible para estar disponibles para asuntos urgentes fuera del horario de [place], no estamos disponibles las 24 horas del da, los 7 [address]  [address] de la [place].   Si tiene un problema urgente y no puede comunicarse con nosotros, puede optar por buscar atencin mdica  en el consultorio de su doctor(a), en una clnica privada, en un centro de atencin urgente o en una sala de emergencias.  Si tiene Engineer, drilling, por favor llame inmediatamente al 911 o vaya a la sala de emergencias.  Nmeros de bper  - Dr. Bary Likes: [phone]  - Dra. Annette Barters: [phone]  - Dr. Felipe Horton: [phone]   En caso de inclemencias del tiempo, por favor llame a Lajuan Pila principal al [phone] para una actualizacin sobre el [place] de cualquier retraso o cierre.  Consejos para la medicacin en dermatologa: Por favor, guarde las cajas en las que vienen los medicamentos de uso tpico para ayudarle a seguir las instrucciones sobre dnde y cmo usarlos. Las farmacias generalmente imprimen las instrucciones del medicamento slo en las cajas y no directamente en los tubos del [place].   Si su medicamento es muy caro, por favor, pngase en contacto con Bettyjane Brunet llamando al [phone] y presione la opcin 4 o envenos un mensaje a travs de Clinical cytogeneticist.   No podemos decirle cul ser su copago por los medicamentos por adelantado ya que esto es diferente dependiendo de la cobertura de su seguro. Sin embargo, es posible que podamos encontrar un medicamento sustituto a Audiological scientist un formulario para que el seguro cubra el medicamento que se considera necesario.   Si se requiere una autorizacin previa para que su compaa de seguros [place] su medicamento, por favor permtanos de 1 a 2 das hbiles para completar este proceso.  Los precios de los medicamentos varan  con frecuencia dependiendo del Environmental consultant de dnde se surte la receta y alguna farmacias pueden ofrecer precios ms baratos.  El sitio web www.goodrx.com tiene cupones para medicamentos de Health and safety inspector. Los precios aqu no tienen en cuenta lo que podra costar con la ayuda del seguro (puede ser ms barato con su seguro), pero el sitio web puede darle el precio si no utiliz Tourist information centre manager.  - Puede imprimir el  cupn correspondiente y llevarlo con su receta a la farmacia.  - Tambin puede pasar por nuestra oficina durante el horario de atencin regular y Education officer, museum una tarjeta de cupones de GoodRx.  - Si necesita que su receta se enve electrnicamente a una farmacia diferente, informe a nuestra oficina a travs de MyChart de  o por telfono llamando al [phone] y presione la opcin 4.

## 2023-12-14 NOTE — Progress Notes (Signed)
 Follow-Up Visit   Subjective  Matthew Romero is a 33 y.o. male who presents for the following: Skin Cancer Screening and Full Body Skin Exam. Hx of dysplastic nevi.  6 month recheck dysplastic nevi biopsied 03/05/2023. LUQ periumbilical, LLQ periumbilical, R superior medial buttock. Moderate atypia  Biopsy nevus at suprapubic area.   The patient presents for Total-Body Skin Exam (TBSE) for skin cancer screening and mole check. The patient has spots, moles and lesions to be evaluated, some may be new or changing and the patient may have concern these could be cancer.  The following portions of the chart were reviewed this encounter and updated as appropriate: medications, allergies, medical history  Review of Systems:  No other skin or systemic complaints except as noted in HPI or Assessment and Plan.  Objective  Well appearing patient in no apparent distress; mood and affect are within normal limits.  A full examination was performed including scalp, head, eyes, ears, nose, lips, neck, chest, axillae, abdomen, back, buttocks, bilateral upper extremities, bilateral lower extremities, hands, feet, fingers, toes, fingernails, and toenails. All findings within normal limits unless otherwise noted below.   Relevant physical exam findings are noted in the Assessment and Plan.  Suprapubic 0.9 x 0.7 cm regular brown macule   Left Low Back 5.5 cm lat to spine 0.3 cm black macule           Assessment & Plan   SKIN CANCER SCREENING PERFORMED TODAY.  ACTINIC DAMAGE - Chronic condition, secondary to cumulative UV/sun exposure - diffuse scaly erythematous macules with underlying dyspigmentation - Recommend daily broad spectrum sunscreen SPF 30+ to sun-exposed areas, reapply every 2 hours as needed.  - Staying in the shade or wearing long sleeves, sun glasses (UVA+UVB protection) and wide brim hats (4-inch brim around the entire circumference of the hat) are also recommended for  sun protection.  - Call for new or changing lesions.  LENTIGINES, SEBORRHEIC KERATOSES, HEMANGIOMAS - Benign normal skin lesions - Benign-appearing - Call for any changes  MELANOCYTIC NEVI - Tan-brown and/or pink-flesh-colored symmetric macules and papules - 1 mm med brown macule at lateral L-5 finger. - Benign appearing on exam today - Observation - Call clinic for new or changing moles - Recommend daily use of broad spectrum spf 30+ sunscreen to sun-exposed areas.   SEBORRHEIC DERMATITIS Exam: Pink patches with greasy scale at forehead, cheeks, chin, nose Chronic and persistent condition with duration or expected duration over one year. Condition is bothersome/symptomatic for patient. Currently flared. Seborrheic Dermatitis is a chronic persistent rash characterized by pinkness and scaling most commonly of the mid face but also can occur on the scalp (dandruff), ears; mid chest, mid back and groin.  It tends to be exacerbated by stress and cooler weather.  People who have neurologic disease may experience new onset or exacerbation of existing seborrheic dermatitis.  The condition is not curable but treatable and can be controlled. Treatment Plan: Start Ketoconazole shampoo 2-3 times a week lather on scalp and face, leave on 5-8 minutes, rinse out. Hydrocortisone 2.5% cream apply at bedtime M,W F as needed for redness/scale on face  ANDROGENETIC ALOPECIA (MALE PATTERN HAIR LOSS) Exam: Frontal scalp thinning with intact frontal hairline and miniaturization  Chronic condition with duration or expected duration over one year. Currently well-controlled. Androgenetic Alopecia (or Male pattern hair loss) refers to the common patterned hair loss affecting many men.  Male pattern alopecia is mediated by dihydrotestosterone which induces miniaturization of androgen-sensitive hair follicles.  It is chronic and persistent, but treatable; not curable. Topical treatment includes: - 5% topical  Minoxidil Oral treatment includes: - Finasteride 1 mg qd - Minoxidil 1.25 - 5 mg qd - Dutasteride 0.5 mg qd Adjunct therapy includes: - Low Level Laser Light Therapy (LLLT) - Platelet-rich Plasma injections (PRP) - Hair Transplantation or scalp reduction Treatment Plan: Continue Minoxidil/Finasteride combination pill. If not tolerating well or showing side effects D/C medication. May consider Minoxidil only in future if he has side effects with combination pill. Long term medication management.  Patient is using long term (months to years) prescription medication  to control their dermatologic condition.  These medications require periodic monitoring to evaluate for efficacy and side effects and may require periodic laboratory monitoring.    NEOPLASM OF SKIN (2) Suprapubic Epidermal / dermal shaving  Lesion diameter (cm):  0.9 Informed consent: discussed and consent obtained   Timeout: patient name, date of birth, surgical site, and procedure verified   Procedure prep:  Patient was prepped and draped in usual sterile fashion Prep type:  Isopropyl alcohol  Anesthesia: the lesion was anesthetized in a standard fashion   Anesthetic:  1% lidocaine  w/ epinephrine 1-100,000 buffered w/ 8.4% NaHCO3 Instrument used: DermaBlade and flexible razor blade   Hemostasis achieved with: pressure, aluminum chloride and electrodesiccation   Outcome: patient tolerated procedure well   Post-procedure details: sterile dressing applied and wound care instructions given   Dressing type: bandage and petrolatum   Specimen 1 - Surgical pathology Differential Diagnosis: Nevus, R/O dysplastic nevus  Check Margins: Yes Left Low Back 5.5 cm lat to spine Epidermal / dermal shaving  Lesion diameter (cm):  0.3 Informed consent: discussed and consent obtained   Timeout: patient name, date of birth, surgical site, and procedure verified   Procedure prep:  Patient was prepped and draped in usual sterile  fashion Prep type:  Isopropyl alcohol  Anesthesia: the lesion was anesthetized in a standard fashion   Anesthetic:  1% lidocaine  w/ epinephrine 1-100,000 buffered w/ 8.4% NaHCO3 Instrument used: flexible razor blade   Hemostasis achieved with: pressure, aluminum chloride and electrodesiccation   Outcome: patient tolerated procedure well   Post-procedure details: sterile dressing applied and wound care instructions given   Dressing type: bandage and petrolatum   Specimen 2 - Surgical pathology Differential Diagnosis: Nevus, R/O dysplastic nevus   Check Margins: Yes Return in about 1 year (around 12/13/2024) for TBSE, HxDN.  I, Jill Parcell, CMA, am acting as scribe for Celine Collard, MD.   Documentation: I have reviewed the above documentation for accuracy and completeness, and I agree with the above.  Celine Collard, MD

## 2023-12-14 NOTE — Progress Notes (Deleted)
   Follow-Up Visit   Subjective  Matthew Romero is a 33 y.o. male who presents for the following: recheck dysplastic nevi biopsied 03/05/2023. LUQ periumbilical, LLQ periumbilical, R superior medial buttock.   Biopsy nevus at suprapubic area. Hx of dysplastic nevi.   The patient has spots, moles and lesions to be evaluated, some may be new or changing and the patient may have concern these could be cancer.   The following portions of the chart were reviewed this encounter and updated as appropriate: medications, allergies, medical history  Review of Systems:  No other skin or systemic complaints except as noted in HPI or Assessment and Plan.  Objective  Well appearing patient in no apparent distress; mood and affect are within normal limits.  ***A full examination was performed including scalp, head, eyes, ears, nose, lips, neck, chest, axillae, abdomen, back, buttocks, bilateral upper extremities, bilateral lower extremities, hands, feet, fingers, toes, fingernails, and toenails. All findings within normal limits unless otherwise noted below.  ***A focused examination was performed of the following areas: ***  Relevant exam findings are noted in the Assessment and Plan.    Assessment & Plan       No follow-ups on file.  ***  Documentation: I have reviewed the above documentation for accuracy and completeness, and I agree with the above.  Celine Collard, MD

## 2023-12-17 LAB — SURGICAL PATHOLOGY

## 2023-12-18 ENCOUNTER — Ambulatory Visit: Payer: Self-pay | Admitting: Dermatology

## 2023-12-21 NOTE — Telephone Encounter (Addendum)
 Tried calling patient regarding results. No answer. LM for patient to return call. ----- Message from Matthew Romero sent at 12/18/2023  7:13 PM EDT ----- FINAL DIAGNOSIS        1. Skin, suprapubic :       MELANOCYTIC NEVUS, COMPOUND LENTIGINOUS TYPE, FOCALLY EXTENDING TO EDGE        2. Skin, left low back 5.5 cm lat to spine :       JUNCTIONAL LENTIGINOUS MELANOCYTIC NEVUS   1&2 - Bother benign Moles No further treatment needed ----- Message ----- From: Interface, Lab In Three Zero One Sent: 12/17/2023   4:14 PM EDT To: Matthew JAYSON Rhyme, MD

## 2023-12-23 NOTE — Telephone Encounter (Addendum)
 Tried calling patient about results. No answer. LM for patient to return call.  ----- Message from Alm Rhyme sent at 12/18/2023  7:13 PM EDT ----- FINAL DIAGNOSIS        1. Skin, suprapubic :       MELANOCYTIC NEVUS, COMPOUND LENTIGINOUS TYPE, FOCALLY EXTENDING TO EDGE        2. Skin, left low back 5.5 cm lat to spine :       JUNCTIONAL LENTIGINOUS MELANOCYTIC NEVUS   1&2 - Bother benign Moles No further treatment needed ----- Message ----- From: Interface, Lab In Three Zero One Sent: 12/17/2023   4:14 PM EDT To: Alm JAYSON Rhyme, MD

## 2023-12-23 NOTE — Telephone Encounter (Signed)
 Patient returned telephone call and advised of results. Patient voiced understanding.

## 2024-03-09 ENCOUNTER — Ambulatory Visit: Payer: Managed Care, Other (non HMO) | Admitting: Dermatology

## 2024-12-13 ENCOUNTER — Ambulatory Visit: Admitting: Dermatology
